# Patient Record
Sex: Male | Born: 1955 | Race: White | Hispanic: No | Marital: Married | Smoking: Never smoker
Health system: Southern US, Community
[De-identification: ages and names within clinical notes are randomized; demographics above are authoritative.]

## PROBLEM LIST (undated history)

## (undated) DIAGNOSIS — G5620 Lesion of ulnar nerve, unspecified upper limb: Secondary | ICD-10-CM

## (undated) DIAGNOSIS — R7989 Other specified abnormal findings of blood chemistry: Secondary | ICD-10-CM

## (undated) DIAGNOSIS — R112 Nausea with vomiting, unspecified: Secondary | ICD-10-CM

## (undated) DIAGNOSIS — I209 Angina pectoris, unspecified: Secondary | ICD-10-CM

## (undated) DIAGNOSIS — E119 Type 2 diabetes mellitus without complications: Secondary | ICD-10-CM

## (undated) DIAGNOSIS — T4145XA Adverse effect of unspecified anesthetic, initial encounter: Secondary | ICD-10-CM

## (undated) DIAGNOSIS — G4730 Sleep apnea, unspecified: Secondary | ICD-10-CM

## (undated) DIAGNOSIS — T8859XA Other complications of anesthesia, initial encounter: Secondary | ICD-10-CM

## (undated) DIAGNOSIS — K589 Irritable bowel syndrome without diarrhea: Secondary | ICD-10-CM

## (undated) DIAGNOSIS — K5792 Diverticulitis of intestine, part unspecified, without perforation or abscess without bleeding: Secondary | ICD-10-CM

## (undated) DIAGNOSIS — R9431 Abnormal electrocardiogram [ECG] [EKG]: Secondary | ICD-10-CM

## (undated) DIAGNOSIS — Z87442 Personal history of urinary calculi: Secondary | ICD-10-CM

## (undated) DIAGNOSIS — M47816 Spondylosis without myelopathy or radiculopathy, lumbar region: Secondary | ICD-10-CM

## (undated) DIAGNOSIS — R931 Abnormal findings on diagnostic imaging of heart and coronary circulation: Secondary | ICD-10-CM

## (undated) DIAGNOSIS — K5790 Diverticulosis of intestine, part unspecified, without perforation or abscess without bleeding: Secondary | ICD-10-CM

## (undated) DIAGNOSIS — K219 Gastro-esophageal reflux disease without esophagitis: Secondary | ICD-10-CM

## (undated) DIAGNOSIS — Z8673 Personal history of transient ischemic attack (TIA), and cerebral infarction without residual deficits: Secondary | ICD-10-CM

## (undated) DIAGNOSIS — H7012 Chronic mastoiditis, left ear: Secondary | ICD-10-CM

## (undated) DIAGNOSIS — Z9889 Other specified postprocedural states: Secondary | ICD-10-CM

## (undated) DIAGNOSIS — I10 Essential (primary) hypertension: Secondary | ICD-10-CM

## (undated) DIAGNOSIS — E669 Obesity, unspecified: Secondary | ICD-10-CM

## (undated) DIAGNOSIS — M1990 Unspecified osteoarthritis, unspecified site: Secondary | ICD-10-CM

## (undated) DIAGNOSIS — L719 Rosacea, unspecified: Secondary | ICD-10-CM

## (undated) DIAGNOSIS — E7800 Pure hypercholesterolemia, unspecified: Secondary | ICD-10-CM

## (undated) DIAGNOSIS — G2581 Restless legs syndrome: Secondary | ICD-10-CM

## (undated) DIAGNOSIS — R002 Palpitations: Secondary | ICD-10-CM

## (undated) DIAGNOSIS — R42 Dizziness and giddiness: Secondary | ICD-10-CM

## (undated) DIAGNOSIS — I639 Cerebral infarction, unspecified: Secondary | ICD-10-CM

## (undated) DIAGNOSIS — K760 Fatty (change of) liver, not elsewhere classified: Secondary | ICD-10-CM

## (undated) DIAGNOSIS — G459 Transient cerebral ischemic attack, unspecified: Secondary | ICD-10-CM

## (undated) DIAGNOSIS — Z6830 Body mass index (BMI) 30.0-30.9, adult: Secondary | ICD-10-CM

## (undated) DIAGNOSIS — R945 Abnormal results of liver function studies: Secondary | ICD-10-CM

## (undated) HISTORY — DX: Diverticulosis of intestine, part unspecified, without perforation or abscess without bleeding: K57.90

## (undated) HISTORY — DX: Pure hypercholesterolemia, unspecified: E78.00

## (undated) HISTORY — PX: COLONOSCOPY: SHX174

## (undated) HISTORY — DX: Palpitations: R00.2

## (undated) HISTORY — PX: ESOPHAGOGASTRODUODENOSCOPY: SHX1529

## (undated) HISTORY — DX: Body mass index (BMI) 30.0-30.9, adult: Z68.30

## (undated) HISTORY — DX: Type 2 diabetes mellitus without complications: E11.9

## (undated) HISTORY — DX: Personal history of transient ischemic attack (TIA), and cerebral infarction without residual deficits: Z86.73

## (undated) HISTORY — DX: Spondylosis without myelopathy or radiculopathy, lumbar region: M47.816

## (undated) HISTORY — DX: Irritable bowel syndrome, unspecified: K58.9

## (undated) HISTORY — PX: LITHOTRIPSY: SUR834

## (undated) HISTORY — PX: MULTIPLE TOOTH EXTRACTIONS: SHX2053

## (undated) HISTORY — DX: Abnormal electrocardiogram (ECG) (EKG): R94.31

## (undated) HISTORY — DX: Diverticulitis of intestine, part unspecified, without perforation or abscess without bleeding: K57.92

## (undated) HISTORY — PX: EYE SURGERY: SHX253

## (undated) HISTORY — DX: Lesion of ulnar nerve, unspecified upper limb: G56.20

## (undated) HISTORY — PX: OTHER SURGICAL HISTORY: SHX169

## (undated) HISTORY — DX: Obesity, unspecified: E66.9

## (undated) HISTORY — DX: Restless legs syndrome: G25.81

## (undated) HISTORY — DX: Rosacea, unspecified: L71.9

## (undated) HISTORY — DX: Abnormal findings on diagnostic imaging of heart and coronary circulation: R93.1

## (undated) HISTORY — DX: Unspecified osteoarthritis, unspecified site: M19.90

## (undated) HISTORY — DX: Dizziness and giddiness: R42

## (undated) HISTORY — DX: Chronic mastoiditis, left ear: H70.12

---

## 2000-07-28 ENCOUNTER — Encounter: Admission: RE | Admit: 2000-07-28 | Discharge: 2000-07-28 | Payer: Self-pay | Admitting: Family Medicine

## 2000-07-28 ENCOUNTER — Encounter: Payer: Self-pay | Admitting: Internal Medicine

## 2000-10-10 ENCOUNTER — Emergency Department (HOSPITAL_COMMUNITY): Admission: EM | Admit: 2000-10-10 | Discharge: 2000-10-10 | Payer: Self-pay | Admitting: Emergency Medicine

## 2003-04-07 ENCOUNTER — Encounter: Admission: RE | Admit: 2003-04-07 | Discharge: 2003-04-07 | Payer: Self-pay | Admitting: Internal Medicine

## 2003-04-07 ENCOUNTER — Encounter: Payer: Self-pay | Admitting: Internal Medicine

## 2004-09-28 ENCOUNTER — Emergency Department (HOSPITAL_COMMUNITY): Admission: EM | Admit: 2004-09-28 | Discharge: 2004-09-28 | Payer: Self-pay

## 2007-12-03 ENCOUNTER — Ambulatory Visit (HOSPITAL_COMMUNITY): Admission: RE | Admit: 2007-12-03 | Discharge: 2007-12-03 | Payer: Self-pay | Admitting: Internal Medicine

## 2007-12-29 ENCOUNTER — Emergency Department (HOSPITAL_COMMUNITY): Admission: EM | Admit: 2007-12-29 | Discharge: 2007-12-29 | Payer: Self-pay | Admitting: Emergency Medicine

## 2009-03-05 ENCOUNTER — Emergency Department (HOSPITAL_COMMUNITY): Admission: EM | Admit: 2009-03-05 | Discharge: 2009-03-05 | Payer: Self-pay | Admitting: Emergency Medicine

## 2009-07-29 ENCOUNTER — Ambulatory Visit (HOSPITAL_COMMUNITY): Admission: RE | Admit: 2009-07-29 | Discharge: 2009-07-29 | Payer: Self-pay | Admitting: Internal Medicine

## 2009-08-07 ENCOUNTER — Encounter: Admission: RE | Admit: 2009-08-07 | Discharge: 2009-08-07 | Payer: Self-pay | Admitting: Internal Medicine

## 2009-10-22 ENCOUNTER — Ambulatory Visit (HOSPITAL_COMMUNITY): Admission: RE | Admit: 2009-10-22 | Discharge: 2009-10-22 | Payer: Self-pay | Admitting: Gastroenterology

## 2009-11-05 ENCOUNTER — Encounter: Admission: RE | Admit: 2009-11-05 | Discharge: 2009-11-05 | Payer: Self-pay | Admitting: Gastroenterology

## 2010-04-08 ENCOUNTER — Emergency Department (HOSPITAL_COMMUNITY): Admission: EM | Admit: 2010-04-08 | Discharge: 2010-04-08 | Payer: Self-pay | Admitting: Emergency Medicine

## 2010-06-13 IMAGING — US US ABDOMEN COMPLETE
1 series · 14 of 25 positions shown · non-contrast
Comparison: None

CLINICAL DATA: Right upper quadrant pain and tenderness.  Elevated
liver function tests.

ABDOMINAL ULTRASOUND COMPLETE

[Series 1: us abdomen complete · 0.32mm/px · 14 of 85 slices shown]
[im 1/85]
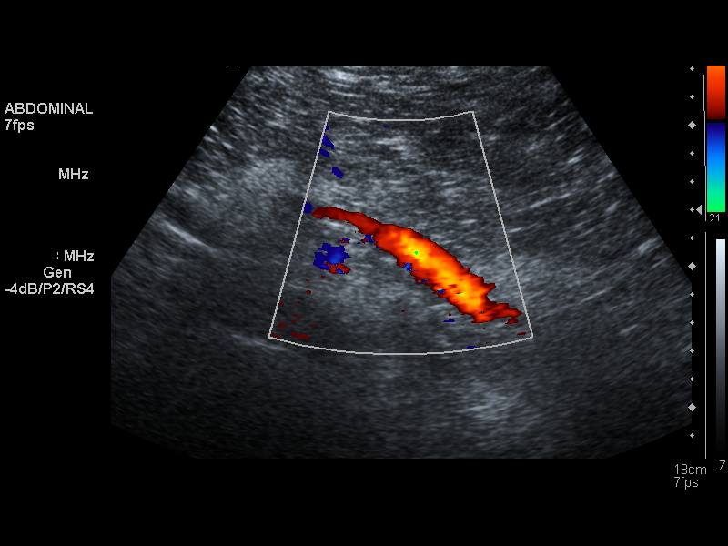
[im 8/85]
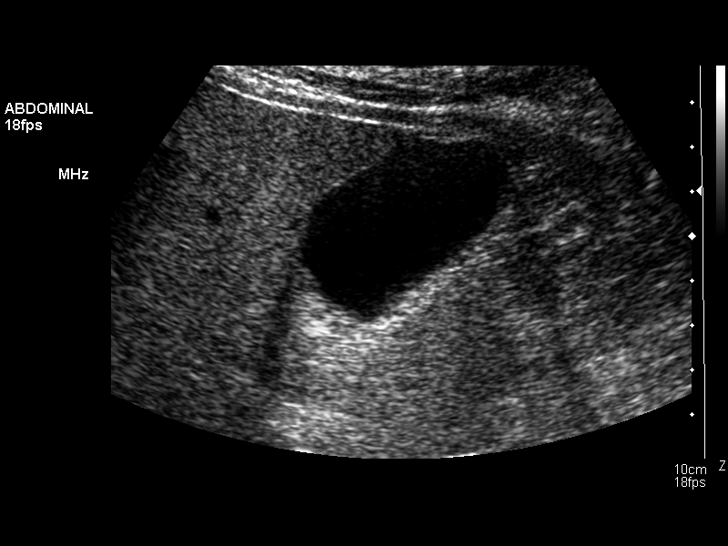
[im 15/85]
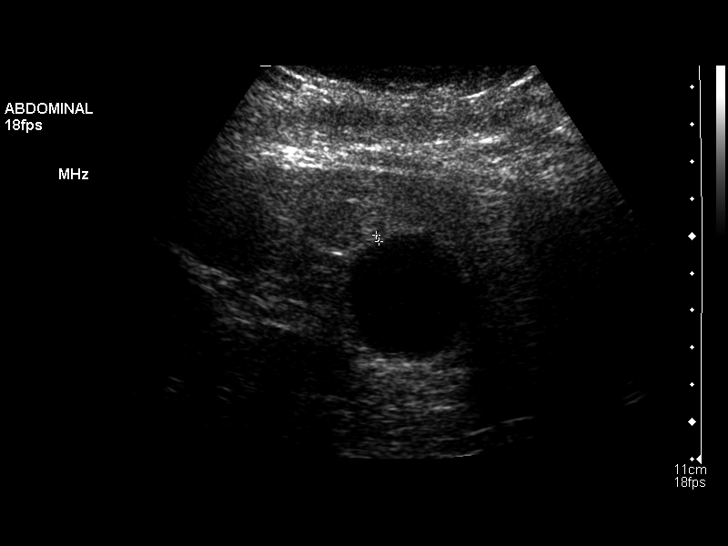
[im 22/85]
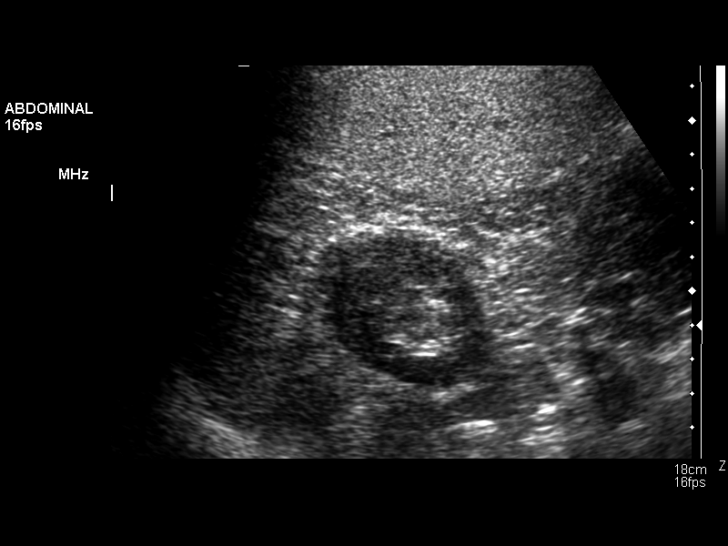
[im 29/85]
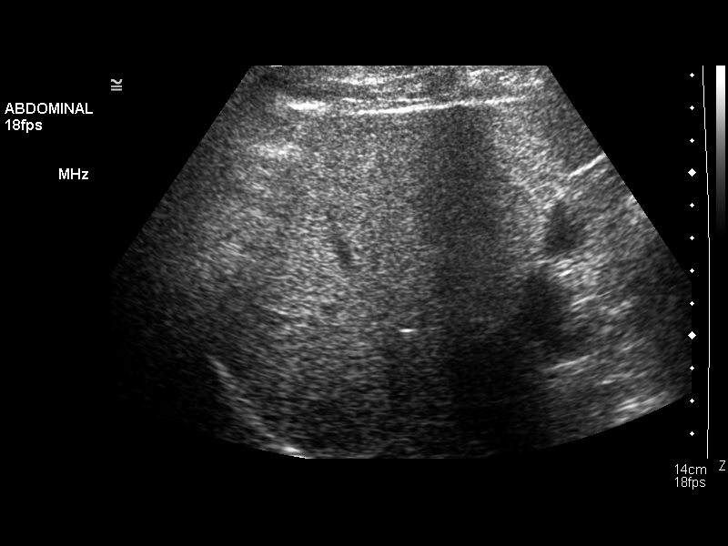
[im 32/85]
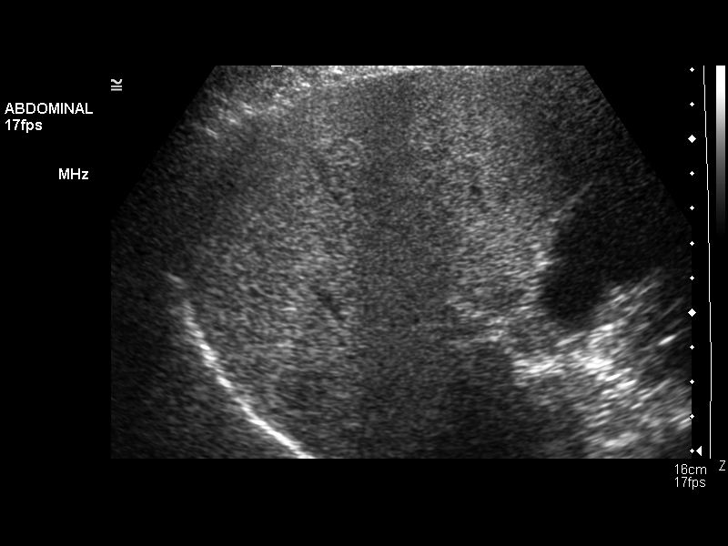
[im 39/85]
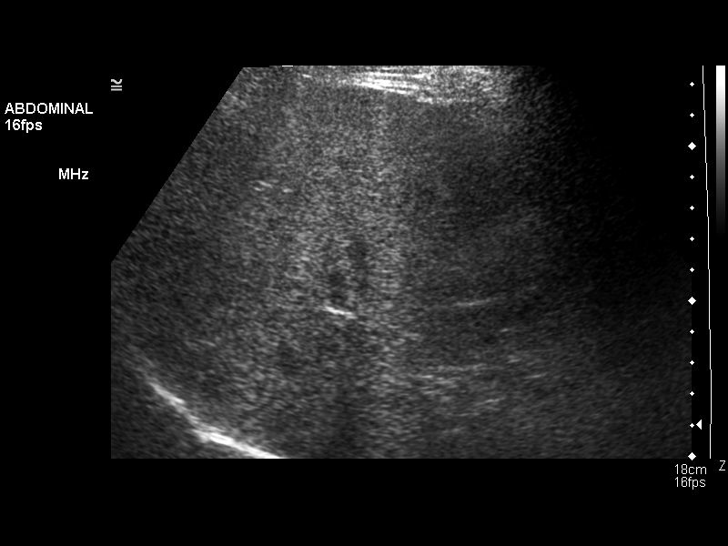
[im 46/85]
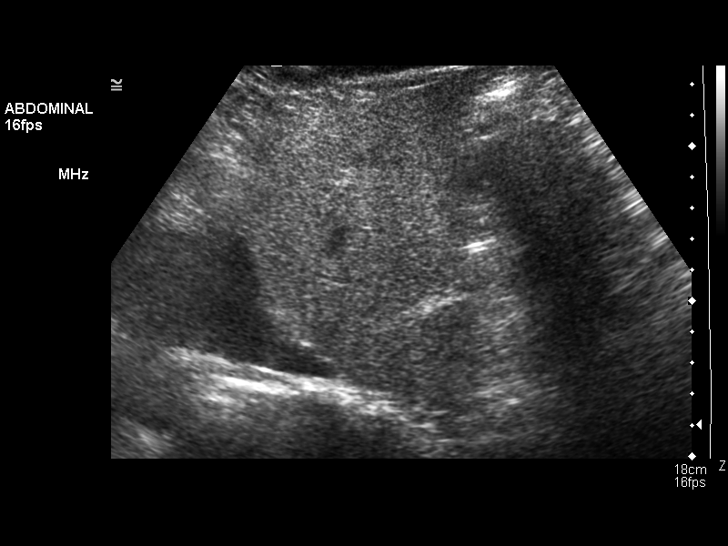
[im 53/85]
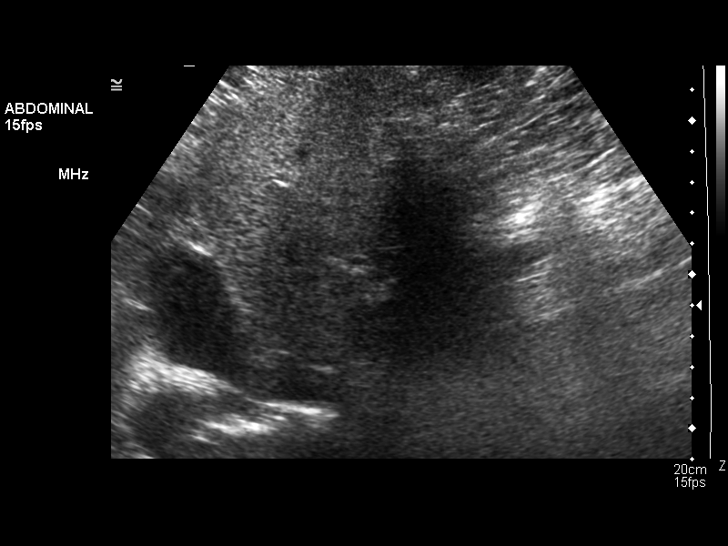
[im 57/85]
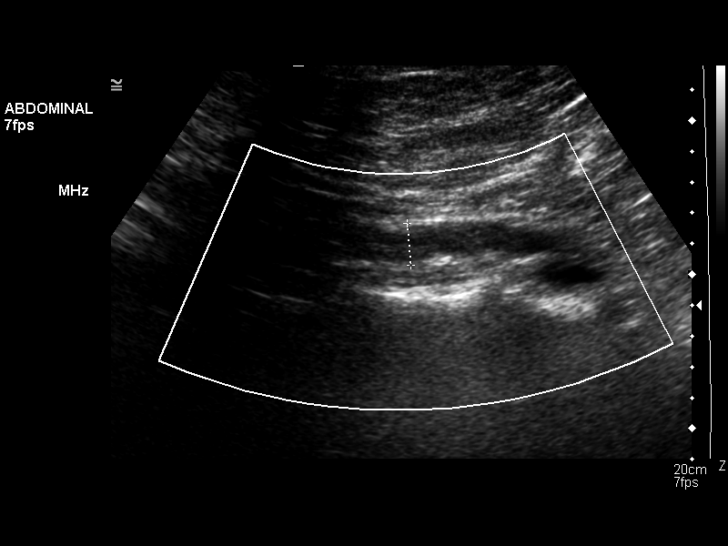
[im 64/85]
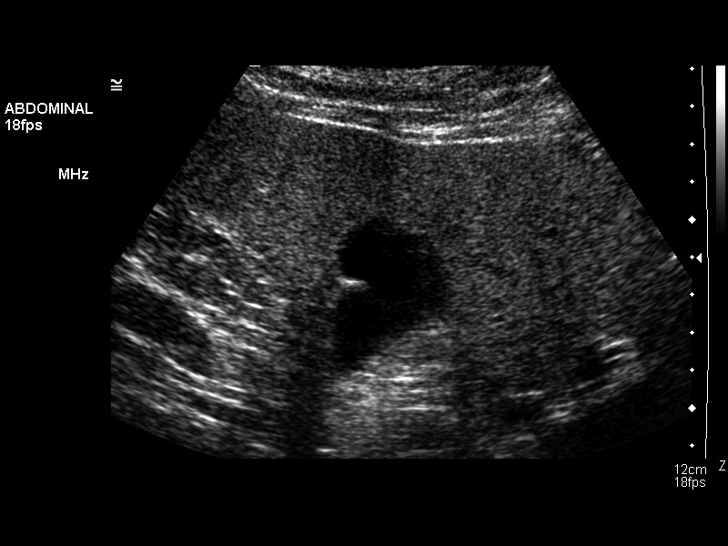
[im 71/85]
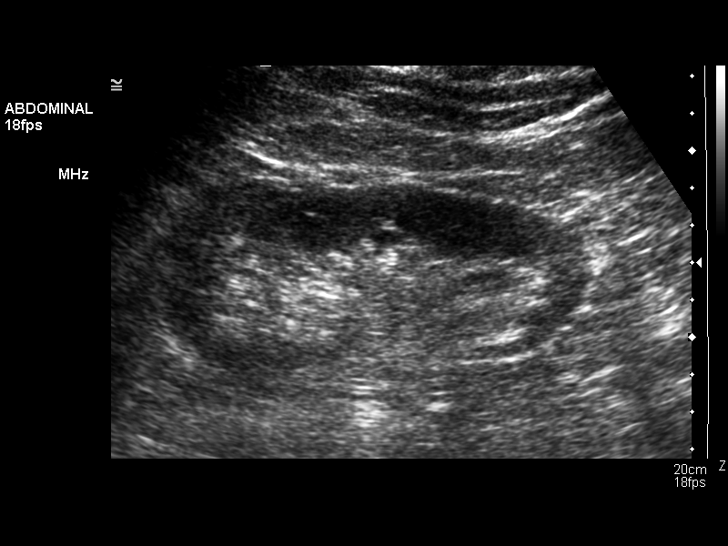
[im 78/85]
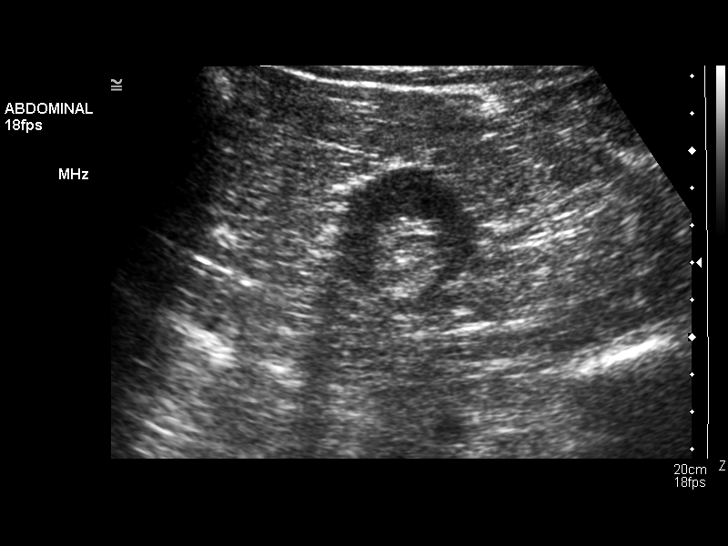
[im 85/85]
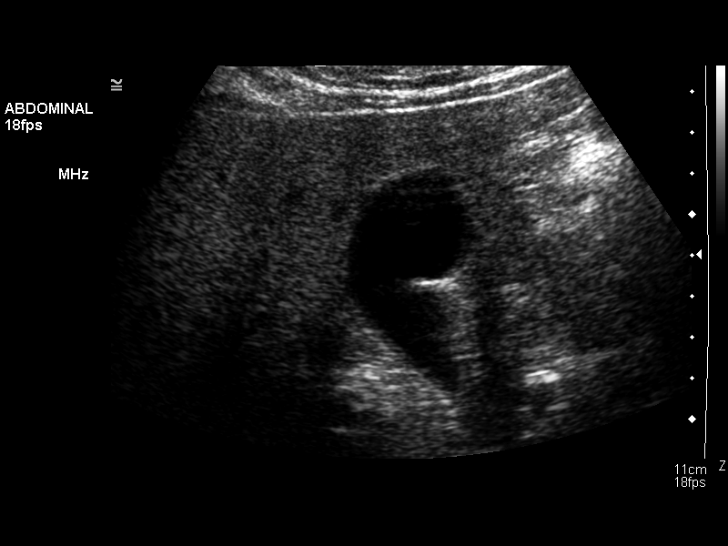

[14 of 25 positions shown; findings below may reference images not displayed]

FINDINGS: Gallbladder:  No gallstones, gallbladder wall thickening, or
pericholecystic fluid.

Common Bile Duct:  Within normal limits in caliber.  Measures 3 mm
in diameter.

Liver:  No focal lesion identified.  Mildly increased parenchymal
echogenicity, consistent with diffuse fatty infiltration of the
liver.

IVC:  Appears normal.

Pancreas:  Although the pancreas is difficult to visualize in its
entirety, no focal pancreatic abnormality is identified.

Spleen:  Within normal limits in size and echotexture.

Right kidney:  Normal in size and parenchymal echogenicity.  No
evidence of mass or hydronephrosis.

Left kidney:  Normal in size and parenchymal echogenicity.  No
evidence of mass or hydronephrosis.

Abdominal Aorta:  No aneurysm identified.
IMPRESSION: 1.  No evidence of gallstones, biliary dilatation, or other acute
findings.
2.  Mild diffuse fatty infiltration of the liver.

## 2010-12-26 ENCOUNTER — Encounter: Payer: Self-pay | Admitting: Urology

## 2011-02-22 LAB — URINALYSIS, ROUTINE W REFLEX MICROSCOPIC
Hgb urine dipstick: NEGATIVE
Nitrite: NEGATIVE
Protein, ur: NEGATIVE mg/dL
Specific Gravity, Urine: 1.021 (ref 1.005–1.030)
Urobilinogen, UA: 1 mg/dL (ref 0.0–1.0)

## 2011-02-22 LAB — URINE MICROSCOPIC-ADD ON

## 2011-05-31 ENCOUNTER — Other Ambulatory Visit: Payer: Self-pay | Admitting: Orthopaedic Surgery

## 2011-05-31 DIAGNOSIS — M25511 Pain in right shoulder: Secondary | ICD-10-CM

## 2011-06-01 ENCOUNTER — Ambulatory Visit
Admission: RE | Admit: 2011-06-01 | Discharge: 2011-06-01 | Disposition: A | Discharge status: Home / Self Care | Payer: BC Managed Care – PPO | Source: Ambulatory Visit | Attending: Orthopaedic Surgery | Admitting: Orthopaedic Surgery

## 2011-06-01 ENCOUNTER — Other Ambulatory Visit: Payer: Self-pay | Admitting: Orthopaedic Surgery

## 2011-06-01 DIAGNOSIS — M25511 Pain in right shoulder: Secondary | ICD-10-CM

## 2011-06-01 DIAGNOSIS — Z139 Encounter for screening, unspecified: Secondary | ICD-10-CM

## 2011-06-10 ENCOUNTER — Emergency Department (HOSPITAL_COMMUNITY): Payer: BC Managed Care – PPO

## 2011-06-10 ENCOUNTER — Emergency Department (HOSPITAL_COMMUNITY)
Admission: EM | Admit: 2011-06-10 | Discharge: 2011-06-10 | Disposition: A | Discharge status: Home / Self Care | Payer: BC Managed Care – PPO | Attending: Emergency Medicine | Admitting: Emergency Medicine

## 2011-06-10 ENCOUNTER — Ambulatory Visit
Admission: RE | Admit: 2011-06-10 | Discharge: 2011-06-10 | Disposition: A | Discharge status: Home / Self Care | Payer: BC Managed Care – PPO | Source: Ambulatory Visit | Attending: Internal Medicine | Admitting: Internal Medicine

## 2011-06-10 ENCOUNTER — Other Ambulatory Visit: Payer: Self-pay | Admitting: Internal Medicine

## 2011-06-10 DIAGNOSIS — R319 Hematuria, unspecified: Secondary | ICD-10-CM | POA: Insufficient documentation

## 2011-06-10 DIAGNOSIS — N390 Urinary tract infection, site not specified: Secondary | ICD-10-CM | POA: Insufficient documentation

## 2011-06-10 DIAGNOSIS — I10 Essential (primary) hypertension: Secondary | ICD-10-CM | POA: Insufficient documentation

## 2011-06-10 DIAGNOSIS — R1110 Vomiting, unspecified: Secondary | ICD-10-CM | POA: Insufficient documentation

## 2011-06-10 DIAGNOSIS — R109 Unspecified abdominal pain: Secondary | ICD-10-CM | POA: Insufficient documentation

## 2011-06-10 LAB — URINALYSIS, ROUTINE W REFLEX MICROSCOPIC
Bilirubin Urine: NEGATIVE
Glucose, UA: NEGATIVE mg/dL
Ketones, ur: NEGATIVE mg/dL
Protein, ur: 30 mg/dL — AB
pH: 5.5 (ref 5.0–8.0)

## 2011-06-10 LAB — DIFFERENTIAL
Basophils Absolute: 0 10*3/uL (ref 0.0–0.1)
Basophils Relative: 0 % (ref 0–1)
Lymphocytes Relative: 7 % — ABNORMAL LOW (ref 12–46)
Monocytes Absolute: 0.6 10*3/uL (ref 0.1–1.0)
Neutro Abs: 15.5 10*3/uL — ABNORMAL HIGH (ref 1.7–7.7)
Neutrophils Relative %: 89 % — ABNORMAL HIGH (ref 43–77)

## 2011-06-10 LAB — CBC
HCT: 46.9 % (ref 39.0–52.0)
Hemoglobin: 16.1 g/dL (ref 13.0–17.0)
MCHC: 34.3 g/dL (ref 30.0–36.0)
WBC: 17.4 10*3/uL — ABNORMAL HIGH (ref 4.0–10.5)

## 2011-06-10 LAB — URINE MICROSCOPIC-ADD ON

## 2011-06-10 LAB — BASIC METABOLIC PANEL
BUN: 19 mg/dL (ref 6–23)
Chloride: 102 mEq/L (ref 96–112)
GFR calc Af Amer: >60 mL/min (ref >60)
GFR calc non Af Amer: >60 mL/min (ref >60)
Glucose, Bld: 125 mg/dL — ABNORMAL HIGH (ref 70–99)
Potassium: 4.5 mEq/L (ref 3.5–5.1)
Sodium: 138 mEq/L (ref 135–145)

## 2011-06-10 MED ORDER — IOHEXOL 300 MG/ML  SOLN
100.0000 mL | Freq: Once | INTRAMUSCULAR | Status: AC | PRN
  Administered 2011-06-10: 100 mL via INTRAVENOUS

## 2011-06-12 LAB — URINE CULTURE: Culture  Setup Time: 201207070416

## 2011-06-20 ENCOUNTER — Ambulatory Visit (HOSPITAL_COMMUNITY)
Admission: RE | Admit: 2011-06-20 | Discharge: 2011-06-20 | Disposition: A | Discharge status: Home / Self Care | Payer: BC Managed Care – PPO | Source: Ambulatory Visit | Attending: Urology | Admitting: Urology

## 2011-06-20 DIAGNOSIS — I10 Essential (primary) hypertension: Secondary | ICD-10-CM | POA: Insufficient documentation

## 2011-06-20 DIAGNOSIS — Z538 Procedure and treatment not carried out for other reasons: Secondary | ICD-10-CM | POA: Insufficient documentation

## 2011-06-20 DIAGNOSIS — Z79899 Other long term (current) drug therapy: Secondary | ICD-10-CM | POA: Insufficient documentation

## 2011-06-20 DIAGNOSIS — M546 Pain in thoracic spine: Secondary | ICD-10-CM | POA: Insufficient documentation

## 2011-06-20 DIAGNOSIS — Z7982 Long term (current) use of aspirin: Secondary | ICD-10-CM | POA: Insufficient documentation

## 2011-06-20 DIAGNOSIS — E7800 Pure hypercholesterolemia, unspecified: Secondary | ICD-10-CM | POA: Insufficient documentation

## 2011-06-20 DIAGNOSIS — N200 Calculus of kidney: Secondary | ICD-10-CM | POA: Insufficient documentation

## 2011-06-23 ENCOUNTER — Ambulatory Visit (HOSPITAL_BASED_OUTPATIENT_CLINIC_OR_DEPARTMENT_OTHER)
Admission: RE | Admit: 2011-06-23 | Discharge: 2011-06-23 | Disposition: A | Discharge status: Home / Self Care | Payer: BC Managed Care – PPO | Source: Ambulatory Visit | Attending: Urology | Admitting: Urology

## 2011-06-23 DIAGNOSIS — N200 Calculus of kidney: Secondary | ICD-10-CM | POA: Insufficient documentation

## 2011-06-23 DIAGNOSIS — E669 Obesity, unspecified: Secondary | ICD-10-CM | POA: Insufficient documentation

## 2011-06-23 DIAGNOSIS — Z01810 Encounter for preprocedural cardiovascular examination: Secondary | ICD-10-CM | POA: Insufficient documentation

## 2011-06-23 DIAGNOSIS — I10 Essential (primary) hypertension: Secondary | ICD-10-CM | POA: Insufficient documentation

## 2011-06-23 DIAGNOSIS — Z01812 Encounter for preprocedural laboratory examination: Secondary | ICD-10-CM | POA: Insufficient documentation

## 2011-06-23 LAB — POCT I-STAT 4, (NA,K, GLUC, HGB,HCT)
Glucose, Bld: 117 mg/dL — ABNORMAL HIGH (ref 70–99)
HCT: 46 % (ref 39.0–52.0)
Hemoglobin: 15.6 g/dL (ref 13.0–17.0)
Potassium: 4.4 mEq/L (ref 3.5–5.1)
Sodium: 141 mEq/L (ref 135–145)

## 2011-06-23 NOTE — Op Note (Signed)
Jose [name], Jose [name]               ACCOUNT NO.:  [id number]  MEDICAL RECORD NO.:  [id number]  LOCATION:  XRAY                         FACILITY:  [facility]  PHYSICIAN:  Shep Porter C. [name], M.D.  DATE OF BIRTH:  [birth year]  DATE OF PROCEDURE:  06/23/2011 DATE OF DISCHARGE:  06/20/2011                              OPERATIVE REPORT   PREOPERATIVE DIAGNOSIS:  Left upper ureteral calculus.  POSTOPERATIVE DIAGNOSIS:  Left renal calculi.  PROCEDURE PERFORMED: 1. Cystoscopy with left retrograde pyelogram including interpretation. 2. Left ureteroscopy. 3. In situ laser lithotripsy. 4. Ureteroscopic stone extraction. 5. Left ureteral stent (6-French, 24 cm Polaris with string attached).  SURGEON:  Zakariye Nee C. [name], M.D.  ANESTHESIA:  General.  SPECIMEN:  Stone, given to the patient.  DRAINS:  As above.  ESTIMATED BLOOD LOSS:  Minimal.  COMPLICATIONS:  None.  INDICATIONS FOR PROCEDURE:  The patient is a 55 year old male who developed acute onset of left flank pain and was seen in my office with KUB but no stone could be definitely visualized.  He did have microscopic hematuria and eventually had to go to the emergency room because of persistent pain.  There, CT scan confirmed stone in the upper left ureteral region near the UPJ.  We therefore elected to proceed with lithotripsy but the stone could not be visualized on the lithotripter, so he is brought to the operating room for ureteroscopic treatment of the stone.  The risks, complications, alternatives, and limitations have been discussed and he understands and has elected to proceed.  DESCRIPTION OF OPERATION:  After informed consent, the patient was brought to the major OR, placed on the table and administered general anesthesia.  He was then moved to the dorsal lithotomy position and his genitalia was sterilely prepped and draped.  An official time-out was then performed.  Initially, the 22-French cystoscope with 12-degree was  advanced under direct vision down to the urethra which was noted to be entirely normal. The prostatic urethra was noted to be free of lesions and nonobstructing.  Upon entering the bladder, it was fully and systemically inspected and noted to be free of any tumor, stones, or inflammatory lesions.  Ureteral orifices were noted to be of normal configuration and position.  A 6-French open-ended ureteral catheter was then passed through the cystoscope, entered the left ureteral orifice and a left retrograde pyelogram was performed in standard fashion by injecting full strength contrast up the left ureter under fluoroscopic control.  This revealed a filling defect in the area of the renal pelvis near the UPJ consistent with his stone.  No other abnormality of the collecting system or ureter was noted however.  A 0.038-inch floppy-tip guidewire was then passed through the open end of catheter into the area of the renal pelvis.  The open area catheter was removed.  This was replaced with a ureteral access sheath.  This was passed over the guidewire and the inner portion or the access sheath and guidewire were removed leaving the access sheath in place after which flexible, digital ureteroscopy was performed using 6-French scope.  This revealed the stone in the renal pelvis which had first began fragmenting with the holmium laser fiber using  at 200 microns fiber; and after it had fragmented partially, I then used the 9-0 basket to grasp the stone and extract it without difficulty.  I also inspected each of the calyces and found a single sub millimeter calcification adherent to the renal papilla.  This was dislodged and extracted with the 9-0 basket as well. A third stone was identified in the calyx and extracted as well.  No further calyceal stones were identified, not were there any stone fragments remaining.  I therefore replaced the guidewire and removed the access sheath.  The cystoscope  was back loaded over the guidewire and the stent was passed over the guidewire at the area of the renal pelvis.  The bladder was then drained and the string left fixed to the distal aspect of the stent was secured to the dorsum of the penis.  I then injected 2% lidocaine jelly in the urethra and placed a penile clamp.  The patient was awakened and taken to recovery room in stable and satisfactory condition.  He tolerated the procedure well.  No intraoperative complications.  He will be given a prescription of 200 mg Pyridium #36 and Tylox #30 with followup in my office in 1 week for stent removal. Written instructions were given upon discharge.     Jose [name], M.D.     MCO/MEDQ  D:  06/23/2011  T:  06/23/2011  Job:  [id number]  Electronically Signed by Ihor Gully M.D. on 06/23/2011 10:16:22 AM

## 2011-08-25 LAB — URINALYSIS, ROUTINE W REFLEX MICROSCOPIC
Leukocytes, UA: NEGATIVE
Nitrite: NEGATIVE
Protein, ur: NEGATIVE
Specific Gravity, Urine: 1.023
Urobilinogen, UA: 0.2

## 2011-08-25 LAB — URINE MICROSCOPIC-ADD ON

## 2011-10-13 ENCOUNTER — Other Ambulatory Visit: Payer: Self-pay | Admitting: Orthopaedic Surgery

## 2011-10-14 ENCOUNTER — Encounter (HOSPITAL_BASED_OUTPATIENT_CLINIC_OR_DEPARTMENT_OTHER): Payer: Self-pay | Admitting: *Deleted

## 2011-10-14 NOTE — H&P (Signed)
    Patient Name: Jose [name] DOB: 09-20-1956 MRN: [id number]    SUBJECTIVE:  Jose [name]  said the injection really  did  nothing for him except elevate his blood sugar.   He called in and we set him up with an MRI scan.  He has terrible pain and says he actually wakes in tears due to the discomfort.  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  Again, this has been going on for more than 5 years.  PAST   MEDICAL   HISTORY:   His   past   medical,   family,   and   social   histories   are   reviewed   and unchanged  based  on  his  03/07/11  intake  sheet.   His  medical  coverage  is  Dr. Kirby Funk.   He  is  on Benicar for hypertension.  He has no drug allergies.  Review of systems reviewed thoroughly and all other systems are negative as related to the chief complaint.  PHYSICAL  EXAM:  Jose [name] remains a well-developed, well-nourished man in no apparent distress, and alert and oriented x3.  He has full cervical motion and no palpable lymphadenopathy.  His right shoulder motion  is  full  at  150,  60,  and  L1  but  he  persists  with  impressive  primary  and  secondary  impingement pain.   He has good cuff strength though he has a very painful arc.  There is no AC pain.  Sensation and motor  function  are  intact  in  his  hands.   Palpable  pulses  on  both  sides.   The  opposite  shoulder  exam  is benign.  He has normal extraocular motion.  Equal and reactive pupils.  RADIOGRAPHS:  Other reviewed images:  I reviewed an MRI scan, films and report, of a study done at [facility]   on   06/02/11.    This   shows   a   full   thickness,   moderately   retracted   tear   of   the supraspinatus.  ASSESSMENT:  Right shoulder rotator cuff tear.  PLAN:   Jose [name]  has an impressive rotator cuff tear by  MRI.   He got no relief with the injection.  He has pain which wakes him from sleep and limits the use of his arm.  He  needs to consider an arthroscopy with repair.  I discussed risks of anesthesia and infection related to this intervention and the fact that he would be  in  a  sling  for  a  month  and  unable  to  do  his  regular  job  for  3  to  4  months.   He  could  certainly  do something light duty much earlier.  He is going to get with his wife and also his employer to figure out timing.   Our plan would  be to  perform an acromioplasty  in  the co-plane of the [facility] joint along  with  the

## 2011-10-17 ENCOUNTER — Encounter (HOSPITAL_BASED_OUTPATIENT_CLINIC_OR_DEPARTMENT_OTHER)
Admission: RE | Admit: 2011-10-17 | Discharge: 2011-10-17 | Disposition: A | Discharge status: Home / Self Care | Payer: BC Managed Care – PPO | Source: Ambulatory Visit | Attending: Orthopaedic Surgery | Admitting: Orthopaedic Surgery

## 2011-10-17 LAB — BASIC METABOLIC PANEL
CO2: 26 mEq/L (ref 19–32)
Calcium: 9.8 mg/dL (ref 8.4–10.5)
Creatinine, Ser: 0.92 mg/dL (ref 0.50–1.35)
GFR calc non Af Amer: >90 mL/min (ref >90)
Glucose, Bld: 170 mg/dL — ABNORMAL HIGH (ref 70–99)
Sodium: 140 mEq/L (ref 135–145)

## 2011-10-18 ENCOUNTER — Encounter (HOSPITAL_BASED_OUTPATIENT_CLINIC_OR_DEPARTMENT_OTHER): Payer: Self-pay | Admitting: Anesthesiology

## 2011-10-18 ENCOUNTER — Encounter (HOSPITAL_BASED_OUTPATIENT_CLINIC_OR_DEPARTMENT_OTHER)
Admission: RE | Disposition: A | Discharge status: Home / Self Care | Payer: Self-pay | Source: Ambulatory Visit | Attending: Orthopaedic Surgery

## 2011-10-18 ENCOUNTER — Ambulatory Visit (HOSPITAL_BASED_OUTPATIENT_CLINIC_OR_DEPARTMENT_OTHER)
Admission: RE | Admit: 2011-10-18 | Discharge: 2011-10-18 | Disposition: A | Discharge status: Home / Self Care | Payer: BC Managed Care – PPO | Source: Ambulatory Visit | Attending: Orthopaedic Surgery | Admitting: Orthopaedic Surgery

## 2011-10-18 ENCOUNTER — Ambulatory Visit (HOSPITAL_BASED_OUTPATIENT_CLINIC_OR_DEPARTMENT_OTHER): Payer: BC Managed Care – PPO | Admitting: Anesthesiology

## 2011-10-18 ENCOUNTER — Encounter (HOSPITAL_BASED_OUTPATIENT_CLINIC_OR_DEPARTMENT_OTHER): Payer: Self-pay | Admitting: *Deleted

## 2011-10-18 DIAGNOSIS — Z01812 Encounter for preprocedural laboratory examination: Secondary | ICD-10-CM | POA: Insufficient documentation

## 2011-10-18 DIAGNOSIS — M719 Bursopathy, unspecified: Secondary | ICD-10-CM | POA: Insufficient documentation

## 2011-10-18 DIAGNOSIS — M67919 Unspecified disorder of synovium and tendon, unspecified shoulder: Secondary | ICD-10-CM | POA: Insufficient documentation

## 2011-10-18 DIAGNOSIS — E669 Obesity, unspecified: Secondary | ICD-10-CM | POA: Insufficient documentation

## 2011-10-18 DIAGNOSIS — I10 Essential (primary) hypertension: Secondary | ICD-10-CM | POA: Insufficient documentation

## 2011-10-18 DIAGNOSIS — M25819 Other specified joint disorders, unspecified shoulder: Secondary | ICD-10-CM | POA: Insufficient documentation

## 2011-10-18 HISTORY — PX: SHOULDER ARTHROSCOPY: SHX128

## 2011-10-18 HISTORY — DX: Essential (primary) hypertension: I10

## 2011-10-18 SURGERY — ARTHROSCOPY, SHOULDER
Anesthesia: General | Site: Shoulder | Laterality: Right | Wound class: Clean

## 2011-10-18 MED ORDER — KETOROLAC TROMETHAMINE 30 MG/ML IJ SOLN
15.0000 mg | Freq: Once | INTRAMUSCULAR | Status: AC | PRN
  Administered 2011-10-18: 30 mg via INTRAVENOUS

## 2011-10-18 MED ORDER — EPHEDRINE SULFATE 50 MG/ML IJ SOLN
INTRAMUSCULAR | Status: DC | PRN
  Administered 2011-10-18: 10 mg via INTRAVENOUS

## 2011-10-18 MED ORDER — ONDANSETRON HCL 4 MG/2ML IJ SOLN
INTRAMUSCULAR | Status: DC | PRN
  Administered 2011-10-18: 4 mg via INTRAVENOUS

## 2011-10-18 MED ORDER — DEXAMETHASONE SODIUM PHOSPHATE 10 MG/ML IJ SOLN
INTRAMUSCULAR | Status: DC | PRN
  Administered 2011-10-18: 10 mg via INTRAVENOUS

## 2011-10-18 MED ORDER — HYDROCODONE-ACETAMINOPHEN 5-325 MG PO TABS: 2.0000 | ORAL_TABLET | Freq: Four times a day (QID) | ORAL | Status: DC | PRN

## 2011-10-18 MED ORDER — BUPIVACAINE-EPINEPHRINE PF 0.5-1:200000 % IJ SOLN
INTRAMUSCULAR | Status: DC | PRN
  Administered 2011-10-18: 30 mL

## 2011-10-18 MED ORDER — MIDAZOLAM HCL 2 MG/2ML IJ SOLN
2.0000 mg | Freq: Once | INTRAMUSCULAR | Status: AC
  Administered 2011-10-18: 2 mg via INTRAVENOUS

## 2011-10-18 MED ORDER — PROMETHAZINE HCL 25 MG/ML IJ SOLN: 6.2500 mg | INTRAMUSCULAR | Status: DC | PRN

## 2011-10-18 MED ORDER — PROPOFOL 10 MG/ML IV EMUL
INTRAVENOUS | Status: DC | PRN
  Administered 2011-10-18: 200 mg via INTRAVENOUS

## 2011-10-18 MED ORDER — SUCCINYLCHOLINE CHLORIDE 20 MG/ML IJ SOLN
INTRAMUSCULAR | Status: DC | PRN
  Administered 2011-10-18: 100 mg via INTRAVENOUS

## 2011-10-18 MED ORDER — CEFAZOLIN SODIUM 1-5 GM-% IV SOLN
INTRAVENOUS | Status: DC | PRN
  Administered 2011-10-18: 2 g via INTRAVENOUS

## 2011-10-18 MED ORDER — LACTATED RINGERS IV SOLN
INTRAVENOUS | Status: DC
  Administered 2011-10-18 (×2): via INTRAVENOUS

## 2011-10-18 MED ORDER — HYDROMORPHONE HCL PF 1 MG/ML IJ SOLN
0.2500 mg | INTRAMUSCULAR | Status: DC | PRN
  Administered 2011-10-18 (×2): 0.5 mg via INTRAVENOUS

## 2011-10-18 MED ORDER — SODIUM CHLORIDE 0.9 % IR SOLN
Status: DC | PRN
  Administered 2011-10-18 (×2): 3000 mL
  Administered 2011-10-18: 6000 mL

## 2011-10-18 MED ORDER — FENTANYL CITRATE 0.05 MG/ML IJ SOLN
INTRAMUSCULAR | Status: DC | PRN
  Administered 2011-10-18: 50 ug via INTRAVENOUS

## 2011-10-18 MED ORDER — LIDOCAINE HCL 1.5 % IJ SOLN
INTRAMUSCULAR | Status: DC | PRN
  Administered 2011-10-18: 60 mL via INTRADERMAL

## 2011-10-18 MED ORDER — MEPERIDINE HCL 25 MG/ML IJ SOLN: 6.2500 mg | INTRAMUSCULAR | Status: DC | PRN

## 2011-10-18 MED ORDER — FENTANYL CITRATE 0.05 MG/ML IJ SOLN
100.0000 ug | Freq: Once | INTRAMUSCULAR | Status: AC
  Administered 2011-10-18: 100 ug via INTRAVENOUS

## 2011-10-18 SURGICAL SUPPLY — 72 items
ADH SKN CLS APL DERMABOND .7 (GAUZE/BANDAGES/DRESSINGS)
APL SKNCLS STERI-STRIP NONHPOA (GAUZE/BANDAGES/DRESSINGS)
BENZOIN TINCTURE PRP APPL 2/3 (GAUZE/BANDAGES/DRESSINGS) IMPLANT
BLADE CUDA 5.5 (BLADE) IMPLANT
BLADE GREAT WHITE 4.2 (BLADE) ×2 IMPLANT
BLADE SURG 15 STRL LF DISP TIS (BLADE) IMPLANT
BLADE SURG 15 STRL SS (BLADE)
BUR VERTEX HOODED 4.5 (BURR) ×1 IMPLANT
CANISTER OMNI JUG 16 LITER (MISCELLANEOUS) ×2 IMPLANT
CANISTER SUCTION 2500CC (MISCELLANEOUS) IMPLANT
CANNULA SHOULDER 7CM (CANNULA) ×2 IMPLANT
CANNULA TWIST IN 8.25X7CM (CANNULA) IMPLANT
DECANTER SPIKE VIAL GLASS SM (MISCELLANEOUS) IMPLANT
DERMABOND ADVANCED (GAUZE/BANDAGES/DRESSINGS)
DERMABOND ADVANCED .7 DNX12 (GAUZE/BANDAGES/DRESSINGS) IMPLANT
DRAPE STERI 35X30 U-POUCH (DRAPES) ×2 IMPLANT
DRAPE U-SHAPE 47X51 STRL (DRAPES) ×2 IMPLANT
DRAPE U-SHAPE 76X120 STRL (DRAPES) ×4 IMPLANT
DRSG EMULSION OIL 3X3 NADH (GAUZE/BANDAGES/DRESSINGS) ×2 IMPLANT
DRSG PAD ABDOMINAL 8X10 ST (GAUZE/BANDAGES/DRESSINGS) ×3 IMPLANT
DURAPREP 26ML APPLICATOR (WOUND CARE) ×2 IMPLANT
ELECT MENISCUS 165MM 90D (ELECTRODE) IMPLANT
ELECT REM PT RETURN 9FT ADLT (ELECTROSURGICAL) ×2
ELECTRODE REM PT RTRN 9FT ADLT (ELECTROSURGICAL) ×1 IMPLANT
GLOVE BIO SURGEON STRL SZ7 (GLOVE) ×1 IMPLANT
GLOVE BIO SURGEON STRL SZ8.5 (GLOVE) ×2 IMPLANT
GLOVE BIOGEL PI IND STRL 8 (GLOVE) ×1 IMPLANT
GLOVE BIOGEL PI IND STRL 8.5 (GLOVE) ×1 IMPLANT
GLOVE BIOGEL PI INDICATOR 8 (GLOVE) ×1
GLOVE BIOGEL PI INDICATOR 8.5 (GLOVE) ×1
GLOVE SS BIOGEL STRL SZ 8 (GLOVE) ×1 IMPLANT
GLOVE SUPERSENSE BIOGEL SZ 8 (GLOVE) ×2
GOWN PREVENTION PLUS XLARGE (GOWN DISPOSABLE) ×2 IMPLANT
GOWN PREVENTION PLUS XXLARGE (GOWN DISPOSABLE) ×3 IMPLANT
NDL SCORPION MULTI FIRE (NEEDLE) IMPLANT
NDL SUT 6 .5 CRC .975X.05 MAYO (NEEDLE) IMPLANT
NEEDLE MAYO TAPER (NEEDLE)
NEEDLE SCORPION MULTI FIRE (NEEDLE) ×2 IMPLANT
PACK ARTHROSCOPY DSU (CUSTOM PROCEDURE TRAY) ×2 IMPLANT
PACK BASIN DAY SURGERY FS (CUSTOM PROCEDURE TRAY) ×2 IMPLANT
PASSER SUT SWANSON 36MM LOOP (INSTRUMENTS) IMPLANT
PENCIL BUTTON HOLSTER BLD 10FT (ELECTRODE) IMPLANT
Pushlock 4.5 ×2 IMPLANT
SET ARTHROSCOPY TUBING (MISCELLANEOUS) ×2
SET ARTHROSCOPY TUBING LN (MISCELLANEOUS) ×1 IMPLANT
SLEEVE SCD COMPRESS KNEE MED (MISCELLANEOUS) ×1 IMPLANT
SLING ARM FOAM STRAP LRG (SOFTGOODS) IMPLANT
SLING ARM FOAM STRAP MED (SOFTGOODS) IMPLANT
SLING ARM FOAM STRAP SML (SOFTGOODS) IMPLANT
SLING ARM FOAM STRAP XLG (SOFTGOODS) ×1 IMPLANT
SPONGE GAUZE 4X4 12PLY (GAUZE/BANDAGES/DRESSINGS) ×3 IMPLANT
SPONGE GAUZE 4X4 FOR O.R. (GAUZE/BANDAGES/DRESSINGS) ×1 IMPLANT
SPONGE LAP 4X18 X RAY DECT (DISPOSABLE) IMPLANT
STRIP CLOSURE SKIN 1/2X4 (GAUZE/BANDAGES/DRESSINGS) IMPLANT
SUCTION FRAZIER TIP 10 FR DISP (SUCTIONS) IMPLANT
SUT ETHIBOND 2 OS 4 DA (SUTURE) IMPLANT
SUT ETHILON 3 0 PS 1 (SUTURE) IMPLANT
SUT ETHILON 4 0 PS 2 18 (SUTURE) ×2 IMPLANT
SUT FIBERWIRE #2 38 T-5 BLUE (SUTURE) ×4
SUT PDS AB 2-0 CT2 27 (SUTURE) IMPLANT
SUT VIC AB 0 SH 27 (SUTURE) IMPLANT
SUT VIC AB 2-0 SH 27 (SUTURE)
SUT VIC AB 2-0 SH 27XBRD (SUTURE) IMPLANT
SUT VICRYL 4-0 PS2 18IN ABS (SUTURE) IMPLANT
SUTURE FIBERWR #2 38 T-5 BLUE (SUTURE) IMPLANT
SYR BULB 3OZ (MISCELLANEOUS) IMPLANT
TAPE CLOTH SURG 4X10 WHT LF (GAUZE/BANDAGES/DRESSINGS) ×1 IMPLANT
TOWEL OR 17X24 6PK STRL BLUE (TOWEL DISPOSABLE) ×2 IMPLANT
TOWEL OR NON WOVEN STRL DISP B (DISPOSABLE) ×2 IMPLANT
WAND STAR VAC 90 (SURGICAL WAND) ×2 IMPLANT
WATER STERILE IRR 1000ML POUR (IV SOLUTION) ×2 IMPLANT
YANKAUER SUCT BULB TIP NO VENT (SUCTIONS) IMPLANT

## 2011-10-18 NOTE — Op Note (Signed)
Jose [name], Jose [name] NO.:  [id number]  MEDICAL RECORD NO.:  [id number]  LOCATION:                               FACILITY:  [facility]  PHYSICIAN:  Lubertha Basque. Jacquita Mulhearn, M.D.DATE OF BIRTH:  1956-08-06  DATE OF PROCEDURE:  10/18/2011 DATE OF DISCHARGE:                              OPERATIVE REPORT   PREOPERATIVE DIAGNOSES: 1. Right shoulder impingement. 2. Right shoulder acromioclavicular degeneration. 3. Right shoulder rotator cuff tear.  POSTOPERATIVE DIAGNOSES: 1. Right shoulder impingement. 2. Right shoulder acromioclavicular degeneration. 3. Right shoulder rotator cuff tear.  PROCEDURES: 1. Right shoulder arthroscopic acromioplasty. 2. Right shoulder arthroscopic partial claviculectomy. 3. Right shoulder arthroscopic rotator cuff repair.  ANESTHESIA:  General and block.  ATTENDING SURGEON:  Lubertha Basque. Jerl Santos, M.D.  ASSISTANT:  Lindwood Qua, P.A.  INDICATION FOR PROCEDURE:  The patient is a 55 year old male with many year history of right shoulder difficulty.  This has persisted despite oral antiinflammatories and injection which did not afford even transient relief.  He has been through therapy programs as well.  By MRI scan, he has rotator cuff tear and things consistent with impingement. He has pain which limits his ability to rest and use his arm.  He is offered an arthroscopy.  Informed operative consent was obtained after discussion of possible complications, including reaction to anesthesia and infection.  SUMMARY OF FINDINGS AND PROCEDURE:  Under general anesthesia and a block, a right shoulder arthroscopy was performed.  The glenohumeral joint showed no degenerative changes and the biceps tendon looked normal.  The rotator cuff had a tear seen from below.  In the subacromial space, this was confirmed with about a 1.5 cm cuff tear in the supraspinatus portion of the cuff which was minimally retracted and of good tissue.  I performed an  acromioplasty and a partial claviculectomy, decompressing the rotator cuff and then repaired the cuff tear with two reverse mattress sutures of FiberWire, secured with PushLock anchors over the bleeding bed of bone.  Lindwood Qua assisted throughout and was enviable to the completion of the case, and that he passed instruments and made this possible in an arthroscopic fashion.  DESCRIPTION OF PROCEDURE:  The patient was taken to the operating suite where general anesthetic applied without difficulty.  He was also given a block in the preanesthesia area.  He was positioned in the beachchair position and prepped and draped in normal sterile fashion.  After administration of IV Kefzol, an arthroscopy of the right shoulder was performed through total of 3 portals.  Findings were as noted above and procedure consisted of the acromioplasty done with a bur in the lateral position followed by transfer of the bur to the posterior position.  I performed a partial undersurface claviculectomy in a similar fashion. We then freshened the rotator cuff stable tissues of the torn area and created a bleeding bed of bone.  I used a scorpion device to pass two reverse mattress sutures with #2 FiberWire and then secured these over the bleeding bed of bone, repairing the rotator cuff nicely. Arthroscopic equipment was removed after the shoulder was irrigated. Simple sutures of nylon were used to loosely reapproximate the portals, followed by Adaptic,  dry gauze, and tape.  Estimated blood loss and intraoperative fluids obtained from anesthesia records.  DISPOSITION:  The patient was extubated in the operating room and taken to recovery room in stable addition.  Plans were for him to go home the same day, and followup in the office closely.  I will contact him by phone tonight.     Lubertha Basque Jerl Santos, M.D.     PGD/MEDQ  D:  10/18/2011  T:  10/18/2011  Job:  [id number]

## 2011-10-18 NOTE — Anesthesia Preprocedure Evaluation (Addendum)
Anesthesia Evaluation  Patient identified by MRN, date of birth, ID band Patient awake    Reviewed: Allergy & Precautions, H&P , NPO status , Patient's Chart, lab work & pertinent test results  History of Anesthesia Complications Negative for: history of anesthetic complications  Airway Mallampati: II  Neck ROM: Full    Dental  (+) Teeth Intact and Dental Advisory Given,    Pulmonary neg pulmonary ROS,  clear to auscultation        Cardiovascular hypertension, Regular Normal    Neuro/Psych  Neuromuscular disease    GI/Hepatic Neg liver ROS, hiatal hernia,   Endo/Other  Negative Endocrine ROS  Renal/GU negative Renal ROS     Musculoskeletal   Abdominal (+) obese,   Peds  Hematology   Anesthesia Other Findings   Reproductive/Obstetrics                          Anesthesia Physical Anesthesia Plan  ASA: II  Anesthesia Plan: General   Post-op Pain Management:    Induction: Intravenous  Airway Management Planned: Oral ETT  Additional Equipment:   Intra-op Plan:   Post-operative Plan: Extubation in OR  Informed Consent: I have reviewed the patients History and Physical, chart, labs and discussed the procedure including the risks, benefits and alternatives for the proposed anesthesia with the patient or authorized representative who has indicated his/her understanding and acceptance.   Dental advisory given  Plan Discussed with: CRNA and Surgeon  Anesthesia Plan Comments:         Anesthesia Quick Evaluation

## 2011-10-18 NOTE — Transfer of Care (Signed)
Immediate Anesthesia Transfer of Care Note  Patient: Jose [name]  Procedure(s) Performed:  ARTHROSCOPY SHOULDER - right shoulder arthroscopy , acromioplasty, partial DCR and rotator cuff repair  Patient Location: PACU  Anesthesia Type: General  Level of Consciousness: awake  Airway & Oxygen Therapy: Patient Spontanous Breathing and Patient connected to face mask oxygen  Post-op Assessment: Report given to PACU RN and Post -op Vital signs reviewed and stable  Post vital signs: Reviewed and stable  Complications: No apparent anesthesia complications

## 2011-10-18 NOTE — Addendum Note (Signed)
Addendum  created 10/18/11 1418 by Josepha Pigg, MD   Modules edited:Notes Section, Orders

## 2011-10-18 NOTE — H&P (Signed)
H&P reviewed and patient examined.  No significant changes.  Acceptable for surgery.  Jose [name] G 

## 2011-10-18 NOTE — Interval H&P Note (Signed)
History and Physical Interval Note:   10/18/2011   7:51 AM   Jose [name]  has presented today for surgery, with the diagnosis of impingement and rotator cuff tear right shoulder  The various methods of treatment have been discussed with the patient and family. After consideration of risks, benefits and other options for treatment, the patient has consented to  Procedure(s): ARTHROSCOPY SHOULDER as a surgical intervention .  The patients' history has been reviewed, patient examined, no change in status, stable for surgery.  I have reviewed the patients' chart and labs.  Questions were answered to the patient's satisfaction.     Velna Ochs  MD

## 2011-10-18 NOTE — Brief Op Note (Signed)
Jose [name] [id number] 10/18/2011   PRE-OP DIAGNOSIS: Right sh imp, RCT  POST-OP DIAGNOSIS: same  PROCEDURE: Right sh aply, clav, and RCR  ANESTHESIA: General and block  Valree Feild G   Dictation #:  [id number]

## 2011-10-18 NOTE — Anesthesia Postprocedure Evaluation (Signed)
  Anesthesia Post-op Note  Patient: Jose [name]  Procedure(s) Performed:  ARTHROSCOPY SHOULDER - right shoulder arthroscopy , acromioplasty, partial DCR and rotator cuff repair  Patient Location: PACU  Anesthesia Type: GA combined with regional for post-op pain  Level of Consciousness: awake  Airway and Oxygen Therapy: Patient Spontanous Breathing  Post-op Pain: mild  Post-op Assessment: Post-op Vital signs reviewed  Post-op Vital Signs: stable  Complications: No apparent anesthesia complications 

## 2011-10-18 NOTE — Anesthesia Procedure Notes (Addendum)
Anesthesia Regional Block:  Interscalene brachial plexus block  Pre-Anesthetic Checklist: ,, timeout performed, Correct Patient, Correct Site, Correct Laterality, Correct Procedure, Correct Position, site marked, Risks and benefits discussed, at surgeon's request and post-op pain management  Laterality: Upper  Prep: Betadine and alcohol swabs       Needles:  Injection technique: Single-shot  Needle Type: Stimulator Needle - 40      Needle Gauge: 22 and 22 G  Needle insertion depth: 2 cm   Additional Needles:  Procedures: nerve stimulator Interscalene brachial plexus block  Nerve Stimulator or Paresthesia:  Response: Twitch elicited, 0.5 mA, 0.3 ms,   Additional Responses:   Narrative:  Start time: 10/18/2011 9:30 AM End time: 10/18/2011 9:49 AM  Performed by: Personally   Additional Notes: Block assessed prior to start of surgery. Pt tolerated well.  Interscalene brachial plexus block Procedure Name: Intubation Performed by: Sharyne Richters Pre-anesthesia Checklist: Patient identified, Emergency Drugs available, Suction available, Patient being monitored and Timeout performed Patient Re-evaluated:Patient Re-evaluated prior to inductionOxygen Delivery Method: Circle System Utilized Preoxygenation: Pre-oxygenation with 100% oxygen Intubation Type: IV induction Ventilation: Mask ventilation without difficulty Grade View: Grade I Number of attempts: 1 Airway Equipment and Method: video-laryngoscopy Placement Confirmation: ETT inserted through vocal cords under direct vision,  positive ETCO2 and breath sounds checked- equal and bilateral Secured at: 22 cm Tube secured with: Tape Dental Injury: Teeth and Oropharynx as per pre-operative assessment

## 2011-10-18 NOTE — Anesthesia Postprocedure Evaluation (Signed)
  Anesthesia Post-op Note  Patient: Jose [name]  Procedure(s) Performed:  ARTHROSCOPY SHOULDER - right shoulder arthroscopy , acromioplasty, partial DCR and rotator cuff repair  Patient Location: PACU  Anesthesia Type: GA combined with regional for post-op pain  Level of Consciousness: awake  Airway and Oxygen Therapy: Patient Spontanous Breathing  Post-op Pain: mild  Post-op Assessment: Post-op Vital signs reviewed  Post-op Vital Signs: stable  Complications: No apparent anesthesia complications

## 2011-10-19 ENCOUNTER — Emergency Department (HOSPITAL_COMMUNITY): Payer: BC Managed Care – PPO

## 2011-10-19 ENCOUNTER — Encounter (HOSPITAL_COMMUNITY): Payer: Self-pay | Admitting: Emergency Medicine

## 2011-10-19 ENCOUNTER — Emergency Department (HOSPITAL_COMMUNITY)
Admission: EM | Admit: 2011-10-19 | Discharge: 2011-10-19 | Disposition: A | Discharge status: Home / Self Care | Payer: BC Managed Care – PPO | Attending: Emergency Medicine | Admitting: Emergency Medicine

## 2011-10-19 DIAGNOSIS — I10 Essential (primary) hypertension: Secondary | ICD-10-CM | POA: Insufficient documentation

## 2011-10-19 DIAGNOSIS — Z7982 Long term (current) use of aspirin: Secondary | ICD-10-CM | POA: Insufficient documentation

## 2011-10-19 DIAGNOSIS — J9819 Other pulmonary collapse: Secondary | ICD-10-CM | POA: Insufficient documentation

## 2011-10-19 DIAGNOSIS — R079 Chest pain, unspecified: Secondary | ICD-10-CM | POA: Insufficient documentation

## 2011-10-19 DIAGNOSIS — Z79899 Other long term (current) drug therapy: Secondary | ICD-10-CM | POA: Insufficient documentation

## 2011-10-19 DIAGNOSIS — R110 Nausea: Secondary | ICD-10-CM | POA: Insufficient documentation

## 2011-10-19 DIAGNOSIS — R05 Cough: Secondary | ICD-10-CM | POA: Insufficient documentation

## 2011-10-19 DIAGNOSIS — M549 Dorsalgia, unspecified: Secondary | ICD-10-CM | POA: Insufficient documentation

## 2011-10-19 DIAGNOSIS — J9811 Atelectasis: Secondary | ICD-10-CM

## 2011-10-19 DIAGNOSIS — G8918 Other acute postprocedural pain: Secondary | ICD-10-CM

## 2011-10-19 DIAGNOSIS — R059 Cough, unspecified: Secondary | ICD-10-CM | POA: Insufficient documentation

## 2011-10-19 LAB — DIFFERENTIAL
Basophils Relative: 0 % (ref 0–1)
Eosinophils Absolute: 0 10*3/uL (ref 0.0–0.7)
Eosinophils Relative: 0 % (ref 0–5)
Monocytes Absolute: 0.8 10*3/uL (ref 0.1–1.0)
Monocytes Relative: 4 % (ref 3–12)

## 2011-10-19 LAB — CBC
HCT: 44.6 % (ref 39.0–52.0)
Hemoglobin: 15.8 g/dL (ref 13.0–17.0)
MCH: 30.8 pg (ref 26.0–34.0)
MCHC: 35.4 g/dL (ref 30.0–36.0)
RDW: 13.2 % (ref 11.5–15.5)

## 2011-10-19 LAB — COMPREHENSIVE METABOLIC PANEL
Albumin: 4.2 g/dL (ref 3.5–5.2)
BUN: 20 mg/dL (ref 6–23)
Calcium: 9.8 mg/dL (ref 8.4–10.5)
Creatinine, Ser: 0.81 mg/dL (ref 0.50–1.35)
Total Bilirubin: 1.1 mg/dL (ref 0.3–1.2)
Total Protein: 7.4 g/dL (ref 6.0–8.3)

## 2011-10-19 LAB — POCT I-STAT TROPONIN I: Troponin i, poc: 0 ng/mL (ref 0.00–0.08)

## 2011-10-19 LAB — LIPASE, BLOOD: Lipase: 19 U/L (ref 11–59)

## 2011-10-19 MED ORDER — HYDROMORPHONE HCL PF 1 MG/ML IJ SOLN
1.0000 mg | Freq: Once | INTRAMUSCULAR | Status: AC
  Administered 2011-10-19: 1 mg via INTRAVENOUS
  Filled 2011-10-19: qty 1

## 2011-10-19 MED ORDER — ONDANSETRON HCL 4 MG/2ML IJ SOLN
INTRAMUSCULAR | Status: AC
  Filled 2011-10-19: qty 2

## 2011-10-19 MED ORDER — KETOROLAC TROMETHAMINE 60 MG/2ML IM SOLN
60.0000 mg | Freq: Once | INTRAMUSCULAR | Status: AC
  Administered 2011-10-19: 60 mg via INTRAMUSCULAR
  Filled 2011-10-19: qty 2

## 2011-10-19 MED ORDER — SODIUM CHLORIDE 0.9 % IV SOLN
INTRAVENOUS | Status: DC
  Administered 2011-10-19: 06:00:00 via INTRAVENOUS

## 2011-10-19 MED ORDER — ONDANSETRON 4 MG PO TBDP
ORAL_TABLET | ORAL | Status: AC
  Administered 2011-10-19: 8 mg via ORAL
  Filled 2011-10-19: qty 2

## 2011-10-19 MED ORDER — ONDANSETRON HCL 4 MG/2ML IJ SOLN
4.0000 mg | Freq: Once | INTRAMUSCULAR | Status: AC
  Administered 2011-10-19: 4 mg via INTRAVENOUS

## 2011-10-19 MED ORDER — ONDANSETRON 4 MG PO TBDP
8.0000 mg | ORAL_TABLET | Freq: Once | ORAL | Status: AC
  Administered 2011-10-19: 8 mg via ORAL

## 2011-10-19 MED ORDER — OXYCODONE-ACETAMINOPHEN 5-325 MG PO TABS: ORAL_TABLET | ORAL | Status: DC

## 2011-10-19 MED ORDER — CYCLOBENZAPRINE HCL 10 MG PO TABS
10.0000 mg | ORAL_TABLET | Freq: Once | ORAL | Status: AC
  Administered 2011-10-19: 10 mg via ORAL
  Filled 2011-10-19: qty 1

## 2011-10-19 NOTE — ED Notes (Signed)
Pt to ED after rotator cuff surgery today.  Immediately after rcvng nerve block he began experiencing sub-scapular pain.  Pain continued after pt awoke from surgery, despite nerve block and pain medications.  Pt also began experiencing increased pain on inspiration.  Lung sounds diminshed to R side and present to L.

## 2011-10-19 NOTE — ED Notes (Signed)
Pt has only been given 8mg  odt zofran.  Please disregard MAR which notes that zofran was given twice.

## 2011-10-19 NOTE — ED Notes (Signed)
Updated pt on care, dr ghim on phone with dr Yisroel Ramming.

## 2011-10-19 NOTE — ED Notes (Signed)
Pt moved to room 2.  Multiple attempts at sl.  States pain 5/10 before dilaudid given.  Await results of dilaudid.

## 2011-10-19 NOTE — ED Notes (Signed)
Pt d/c with prescription and incentive spirometer. Pt ambulated to d/c window with spouse. Voiced no complaints at this time. NAD noted.

## 2011-10-19 NOTE — ED Provider Notes (Signed)
History     CSN: [id number] Arrival date & time: 10/19/2011  1:00 AM   First MD Initiated Contact with Patient 10/19/11 0207      No chief complaint on file. right chest/back pain  (Consider location/radiation/quality/duration/timing/severity/associated sxs/prior treatment) HPI  Patient had rotator cuff surgery this morning by Dr. Yisroel Ramming. He relates he had a regional block done by Dr Boneta Lucks in the right side of his neck and about 10 minutes later started having pain in his right chest and right back area. He states the pain has continued and is not controlled by his pain medicines. He has a pleuritic component to the pain but denies feeling short of breath. He also states it hurts more to lay on his right side. He has a mild cough but denies sore throat or fever. He's had nausea without vomiting. He relates he was kept in postop area about 3 hours later than expected until about 4 PM when he was discharged and was rechecked by the anesthesiologist prior to discharge. He relates he called the office about 7 PM and spoke to the PA about the pain and tonight about 9 PM Dr. Yisroel Ramming  called him and he advised him to come to the ED. he states he's never had this pain before.  Primary care Dr. Kirby Funk  Past Medical History  Diagnosis Date  . Hypertension   . Hiatal hernia     Past Surgical History  Procedure Date  . Kidney stones   Rotator cuff surgery  History reviewed. No pertinent family history.  History  Substance Use Topics  . Smoking status: Never Smoker   . Smokeless tobacco: Not on file  . Alcohol Use: Yes   lives with spouse    Review of Systems  All other systems reviewed and are negative.    Allergies  Review of patient's allergies indicates no known allergies.  Home Medications   Current Outpatient Rx  Name Route Sig Dispense Refill  . ASPIRIN 81 MG PO TABS Oral Take 81 mg by mouth daily.      Marland Kitchen VITAMIN D 1000 UNITS PO TABS Oral Take 1,000 Units by  mouth daily.      Marland Kitchen CLOBETASOL PROPIONATE 0.05 % EX CREA Topical Apply topically 2 (two) times daily.      Marland Kitchen HYDROCODONE-ACETAMINOPHEN 5-325 MG PO TABS Oral Take 2 tablets by mouth every 6 (six) hours as needed for pain. 50 tablet 0  . OMEPRAZOLE MAGNESIUM 20 MG PO TBEC Oral Take 20 mg by mouth as needed.      Marland Kitchen VALSARTAN-HYDROCHLOROTHIAZIDE 160-12.5 MG PO TABS Oral Take 1 tablet by mouth daily.        BP 162/105  Pulse 92  Temp(Src) 99 F (37.2 C) (Oral)  Resp 20  SpO2 95% Vital signs hypertensive otherwise normal  Physical Exam  Vitals reviewed. Constitutional: He is oriented to person, place, and time. He appears well-developed and well-nourished.  HENT:  Head: Normocephalic and atraumatic.  Right Ear: External ear normal.  Left Ear: External ear normal.       Bases flushed which he states is normal  Eyes: Conjunctivae and EOM are normal. Pupils are equal, round, and reactive to light.  Neck: Normal range of motion. Neck supple.       No obvious needle marks seen however he has bandages from his rotator cuff surgery present  Cardiovascular: Normal rate, regular rhythm and normal heart sounds.   Pulmonary/Chest:       Patient has  no breath sounds on the right he has normal breath sounds on the left his rib cage is slightly tender to palpation.  Abdominal: Soft. Bowel sounds are normal. He exhibits no distension and no mass. There is no tenderness. There is no rebound and no guarding.  Musculoskeletal:       Patient is wearing a sling on the right upper extremity and he has a dressing on his right shoulder consistent with recent surgery  Neurological: He is alert and oriented to person, place, and time. He has normal reflexes.  Skin: Skin is warm and dry.  Psychiatric: He has a normal mood and affect. His behavior is normal.    ED Course  Procedures (including critical care time)   Medications  0.9 %  sodium chloride infusion (  Intravenous New Bag 10/19/11 0613)    HYDROmorphone (DILAUDID) injection 1 mg (not administered)  ondansetron (ZOFRAN) 4 MG/2ML injection (not administered)  ketorolac (TORADOL) injection 60 mg (60 mg Intramuscular Given 10/19/11 0424)  cyclobenzaprine (FLEXERIL) tablet 10 mg (10 mg Oral Given 10/19/11 0427)  ondansetron (ZOFRAN-ODT) disintegrating tablet 8 mg (8 mg Oral Given 10/19/11 0415)  ondansetron (ZOFRAN) injection 4 mg (4 mg Intravenous Given 10/19/11 0616)    Patient was given Toradol IM with oral Flexeril and Zofran ODT. He relates his pain was not changed. An IV was inserted he was given IV narcotics for pain.  Results for orders placed during the hospital encounter of 10/19/11  CBC      Component Value Range   WBC 18.6 (*) 4.0 - 10.5 (K/uL)   RBC 5.13  4.22 - 5.81 (MIL/uL)   Hemoglobin 15.8  13.0 - 17.0 (g/dL)   HCT 16.1  09.6 - 04.5 (%)   MCV 86.9  78.0 - 100.0 (fL)   MCH 30.8  26.0 - 34.0 (pg)   MCHC 35.4  30.0 - 36.0 (g/dL)   RDW 40.9  81.1 - 91.4 (%)   Platelets 161  150 - 400 (K/uL)  DIFFERENTIAL      Component Value Range   Neutrophils Relative 90 (*) 43 - 77 (%)   Neutro Abs 16.7 (*) 1.7 - 7.7 (K/uL)   Lymphocytes Relative 6 (*) 12 - 46 (%)   Lymphs Abs 1.1  0.7 - 4.0 (K/uL)   Monocytes Relative 4  3 - 12 (%)   Monocytes Absolute 0.8  0.1 - 1.0 (K/uL)   Eosinophils Relative 0  0 - 5 (%)   Eosinophils Absolute 0.0  0.0 - 0.7 (K/uL)   Basophils Relative 0  0 - 1 (%)   Basophils Absolute 0.0  0.0 - 0.1 (K/uL)  COMPREHENSIVE METABOLIC PANEL      Component Value Range   Sodium 136  135 - 145 (mEq/L)   Potassium 4.4  3.5 - 5.1 (mEq/L)   Chloride 100  96 - 112 (mEq/L)   CO2 24  19 - 32 (mEq/L)   Glucose, Bld 153 (*) 70 - 99 (mg/dL)   BUN 20  6 - 23 (mg/dL)   Creatinine, Ser 7.82  0.50 - 1.35 (mg/dL)   Calcium 9.8  8.4 - 95.6 (mg/dL)   Total Protein 7.4  6.0 - 8.3 (g/dL)   Albumin 4.2  3.5 - 5.2 (g/dL)   AST 33  0 - 37 (U/L)   ALT 48  0 - 53 (U/L)   Alkaline Phosphatase 88  39 - 117 (U/L)    Total Bilirubin 1.1  0.3 - 1.2 (mg/dL)   GFR  calc non Af Amer >90  >90 (mL/min)   GFR calc Af Amer >90  >90 (mL/min)  LIPASE, BLOOD      Component Value Range   Lipase 19  11 - 59 (U/L)  POCT I-STAT TROPONIN I      Component Value Range   Troponin i, poc 0.00  0.00 - 0.08 (ng/mL)   Comment 3            Laboratory interpretation leukocytosis and hyperglycemia otherwise normal.  Dg Chest 2 View  10/19/2011  *RADIOLOGY REPORT*  Clinical Data: Pleuritic chest pain and loss of right-sided breath sounds, status post rotator cuff surgery.  CHEST - 2 VIEW  Comparison: Chest radiograph performed 03/08/2011  Findings: There is elevation of the right hemidiaphragm, with associated right basilar atelectasis.  Mild left-sided atelectasis is also noted.  There is no evidence of pleural effusion or pneumothorax.  The heart is normal in size; the mediastinal contour is within normal limits.  No acute osseous abnormalities are seen.  Rounded densities overlying the right upper quadrant are likely outside the patient.  IMPRESSION: Elevation of the right hemidiaphragm, with mild bilateral atelectasis noted.  No evidence of pneumothorax.  Original Report Authenticated By: Tonia Ghent, M.D.   CT chest ordered   Date: 10/19/2011  Rate: 80  Rhythm: normal sinus rhythm  QRS Axis: normal  Intervals: normal  ST/T Wave abnormalities: normal  Conduction Disutrbances:none  Narrative Interpretation:   Old EKG Reviewed: unchanged from 06/23/2011   Impression Chest pain/Abdomen pain s/p regional block    Devoria Albe, MD, FACEP   MDM          Ward Givens, MD 10/19/11 [phone]

## 2011-10-19 NOTE — ED Notes (Signed)
Family at bedside. 

## 2011-10-19 NOTE — ED Notes (Signed)
Pt feels he is breathing easier d/t decreased pain.  Pain 4/10 after dilaudid.

## 2011-10-19 NOTE — ED Provider Notes (Signed)
  Physical Exam  BP 124/73  Pulse 74  Temp(Src) 97.8 F (36.6 C) (Oral)  Resp 17  SpO2 96%  Physical Exam  ED Course  Procedures  MDM Received pt in handoff from Dr. Lynelle Doctor.  Anesthesiologist repaged, still awaiting call back.  Pt has had non contrast CT scan per D.r Lynelle Doctor, awaiting result, looking for possible subtle PTX from recent procedure.  Pt requiring additional pain meds, 1 mg dilaudid redosed.      10:56 AM Pt feels improved.  CT scan shows some atelectasis, will give incentive spirometer.  Will change to percocet rather than vicodin that he is on now.  Pt has no further guarding, neg Murphy's sign, no fever.  Cough is improved.  Pt did vomit yesterday and so he may have aspirated some.  Pt's pain developed after regional block, hadn't been intubated yet.  So could be nerve complication as well.  Will need follow up with PCP and Dr. Jerl Santos.    Gavin Pound. Fidelia Cathers, MD 10/19/11 1100

## 2011-10-19 NOTE — ED Notes (Signed)
Pt  St's had rotator cuff repair surg this am  St's after receiving nerve block he developed pain in back,  Pt st's pain has not subsided

## 2011-10-19 NOTE — ED Notes (Signed)
Patient transported to CT 

## 2011-10-19 NOTE — Discharge Instructions (Signed)
 Atelectasis Atelectasis involves a collapse of the small air sacs in the lungs. It is a condition in which all or part of a lung or lungs collapse and become airless. Atelectasis may come on suddenly (acute). Or it may persist long-term (chronic). In acute atelectasis, the lung has recently collapsed. This is mainly significant only for its airlessness. Chronic atelectasis has the airlessness, too. But it also may have infection and scarring (fibrosis). There may also be a widening of the bronchi (bronchiectasis). Bronchiectasis is a disease of the lungs in which the structure of the tubes lining the lungs (bronchi) is distorted. This makes it difficult for bronchial secretions to be expelled. This leads to infections which are difficult to treat. There is often a persistent, productive cough. Smokers have a greater risk of developing all the lung problems. CAUSES  The most common cause of atelectasis is obstruction of a bronchus. The bronchi are the two large branches of the trachea which lead directly to the lungs. Smaller airways (bronchioles) can also become blocked. Obstructions may be caused by mucus plugs, tumors, or inhaled foreign bodies. Outside pressure from tumors, enlarged lymph nodes, or fluid (pleural effusion) may cause the problem. A collapse of a lung when air gets between the lung and the rib cage (pneumothorax) may also lead to atelectasis.  This happens because of the following mechanism:   When an airway becomes blocked, the air in the small air sacs of the lung (alveoli) beyond the blockage is absorbed into the bloodstream. This causes the alveoli to collapse. After a little time, the collapsed lung tissue may fill with blood cells, serum, and mucus. This abnormal collection of material is likely to become infected.   Acute atelectasis is a common problem after surgery. During an operation, portions of the lung may not expand as well as usual. This leads to collapse of the small air  sacs. Injuries can cause a similar collapse. Medications which depress breathing (respiration) may also lead to atelectasis. Anything that decreases the size of breaths taken in can cause atelectasis. This could be pain, a tight bandage around the chest, muscle weakness, nerve problems, or broken ribs.   Any of the causes of acute atelectasis may persist and become chronic. When they become chronic; infection, scarring, and other problems are likely to follow. When they are severe enough, shortness of breath and heart problems may follow.  SYMPTOMS   The loss of working lung tissue leads to shortness of breath. This decreases the blood oxygen level. That makes the heart work harder and beat faster. There may also be cyanosis.  That is a blue color to your nail beds and mucous membranes, such as your lips and mouth.   The severity of symptoms depends on how rapidly this happens and over how much time.   The symptoms are not as bad if the problems come on slowly over a long period of time. This is because your body has time to adapt.   A sudden severe change can result in loss of blood pressure (shock) and even death.  DIAGNOSIS  Your caregiver may suspect atelectasis based on your symptoms and physical findings. A chest X-ray may confirm their suspicions. Blood work and more specialized X-rays are sometimes required.  TREATMENT   The main treatment for sudden atelectasis is correction of the underlying cause. A blockage that cannot be removed by coughing or by suctioning the airways often can be removed by bronchoscopy. Bronchoscopy is like looking into the lung  with a slender telescope. It can be used to take biopsies or to remove a foreign body.   Antibiotics are given for infection. Chronic atelectasis often is treated with antibiotics because infection is almost expected.   Sometimes affected lung parts are surgically removed. This is done if:   Recurring infections become disabling.    Bleeding is significant.   The problems persist despite treatment.   If a tumor is blocking the airway, relieving the obstruction by surgery, radiation therapy, or chemotherapy may prevent the condition from progressing to pneumonia.   Atelectasis due to deficient or ineffective surfactant is directed at treating the low blood oxygen. This is often done with mechanical ventilation. Surfactant drugs are lifesaving for premature babies that are deficient. This therapy is experimental in adults with the acute respiratory distress and reduced surfactant activity.  Possible complications include:  Pneumonia.   Germs in the blood stream (sepsis).   Bronchiectasis.   Fluid accumulating in the lungs.   Lung failure.   This may lead to infection.  Most causes of atelectasis are easily treated and do well. Those caused by a tumor will depend on the ease of treating that tumor. In some cases it is not possible to determine the cause of atelectasis early on. So it is important to follow up with your caregiver. PREVENTION   Stop smoking. This is especially necessary before or following surgery.   Following a surgery:   Breathe deeply.   Cough regularly.   Move around as soon as possible.   Using breathing devices to encourage deep breathing and exercises including changing positions to help drainage of secretions may help prevent atelectasis.   Chest deformities and neurologic conditions causing shallow breathing may benefit from mechanical devices to help breathing. One method is continuous positive airway pressure. These machines deliver oxygen through a nose or face mask. They make sure the airways do not collapse. They do this by keeping a positive pressure in the airway. This stays positive even at the end of a breath. Sometimes additional respiratory support is needed with a mechanical ventilator.  SEEK IMMEDIATE MEDICAL CARE IF:   You develop increasing problems with your  breathing.   You develop a fever over 102 F (38.9 C).   You develop severe chest pain.   You develop severe coughing or cough up any blood.  Document Released: 11/21/2005 Document Revised: 08/03/2011 Document Reviewed: 04/05/2007 [facility] Patient Information 2012 [place], [place].Atelectasis Atelectasis involves a collapse of the small air sacs in the lungs. It is a condition in which all or part of a lung or lungs collapse and become airless. Atelectasis may come on suddenly (acute). Or it may persist long-term (chronic). In acute atelectasis, the lung has recently collapsed. This is mainly significant only for its airlessness. Chronic atelectasis has the airlessness, too. But it also may have infection and scarring (fibrosis). There may also be a widening of the bronchi (bronchiectasis). Bronchiectasis is a disease of the lungs in which the structure of the tubes lining the lungs (bronchi) is distorted. This makes it difficult for bronchial secretions to be expelled. This leads to infections which are difficult to treat. There is often a persistent, productive cough. Smokers have a greater risk of developing all the lung problems. CAUSES  The most common cause of atelectasis is obstruction of a bronchus. The bronchi are the two large branches of the trachea which lead directly to the lungs. Smaller airways (bronchioles) can also become blocked. Obstructions may be caused  by mucus plugs, tumors, or inhaled foreign bodies. Outside pressure from tumors, enlarged lymph nodes, or fluid (pleural effusion) may cause the problem. A collapse of a lung when air gets between the lung and the rib cage (pneumothorax) may also lead to atelectasis.  This happens because of the following mechanism:   When an airway becomes blocked, the air in the small air sacs of the lung (alveoli) beyond the blockage is absorbed into the bloodstream. This causes the alveoli to collapse. After a little time, the collapsed lung  tissue may fill with blood cells, serum, and mucus. This abnormal collection of material is likely to become infected.   Acute atelectasis is a common problem after surgery. During an operation, portions of the lung may not expand as well as usual. This leads to collapse of the small air sacs. Injuries can cause a similar collapse. Medications which depress breathing (respiration) may also lead to atelectasis. Anything that decreases the size of breaths taken in can cause atelectasis. This could be pain, a tight bandage around the chest, muscle weakness, nerve problems, or broken ribs.   Any of the causes of acute atelectasis may persist and become chronic. When they become chronic; infection, scarring, and other problems are likely to follow. When they are severe enough, shortness of breath and heart problems may follow.  SYMPTOMS   The loss of working lung tissue leads to shortness of breath. This decreases the blood oxygen level. That makes the heart work harder and beat faster. There may also be cyanosis.  That is a blue color to your nail beds and mucous membranes, such as your lips and mouth.   The severity of symptoms depends on how rapidly this happens and over how much time.   The symptoms are not as bad if the problems come on slowly over a long period of time. This is because your body has time to adapt.   A sudden severe change can result in loss of blood pressure (shock) and even death.  DIAGNOSIS  Your caregiver may suspect atelectasis based on your symptoms and physical findings. A chest X-ray may confirm their suspicions. Blood work and more specialized X-rays are sometimes required.  TREATMENT   The main treatment for sudden atelectasis is correction of the underlying cause. A blockage that cannot be removed by coughing or by suctioning the airways often can be removed by bronchoscopy. Bronchoscopy is like looking into the lung with a slender telescope. It can be used to take  biopsies or to remove a foreign body.   Antibiotics are given for infection. Chronic atelectasis often is treated with antibiotics because infection is almost expected.   Sometimes affected lung parts are surgically removed. This is done if:   Recurring infections become disabling.   Bleeding is significant.   The problems persist despite treatment.   If a tumor is blocking the airway, relieving the obstruction by surgery, radiation therapy, or chemotherapy may prevent the condition from progressing to pneumonia.   Atelectasis due to deficient or ineffective surfactant is directed at treating the low blood oxygen. This is often done with mechanical ventilation. Surfactant drugs are lifesaving for premature babies that are deficient. This therapy is experimental in adults with the acute respiratory distress and reduced surfactant activity.  Possible complications include:  Pneumonia.   Germs in the blood stream (sepsis).   Bronchiectasis.   Fluid accumulating in the lungs.   Lung failure.   This may lead to infection.  Most causes  of atelectasis are easily treated and do well. Those caused by a tumor will depend on the ease of treating that tumor. In some cases it is not possible to determine the cause of atelectasis early on. So it is important to follow up with your caregiver. PREVENTION   Stop smoking. This is especially necessary before or following surgery.   Following a surgery:   Breathe deeply.   Cough regularly.   Move around as soon as possible.   Using breathing devices to encourage deep breathing and exercises including changing positions to help drainage of secretions may help prevent atelectasis.   Chest deformities and neurologic conditions causing shallow breathing may benefit from mechanical devices to help breathing. One method is continuous positive airway pressure. These machines deliver oxygen through a nose or face mask. They make sure the airways do  not collapse. They do this by keeping a positive pressure in the airway. This stays positive even at the end of a breath. Sometimes additional respiratory support is needed with a mechanical ventilator.  SEEK IMMEDIATE MEDICAL CARE IF:   You develop increasing problems with your breathing.   You develop a fever over 102 F (38.9 C).   You develop severe chest pain.   You develop severe coughing or cough up any blood.  Document Released: 11/21/2005 Document Revised: 08/03/2011 Document Reviewed: 04/05/2007 [facility] Patient Information 2012 [place], [place].

## 2011-10-19 NOTE — ED Notes (Signed)
Instructed pt on incentive spirometry. Pt demonstrated 3x

## 2011-10-19 NOTE — ED Notes (Signed)
Care assumed, introduced self to patient, Patient on stretcher, NAD noted, Resp e/u, ABC intact. Updated on POC, Will continue to monitor.

## 2011-10-25 ENCOUNTER — Encounter (HOSPITAL_BASED_OUTPATIENT_CLINIC_OR_DEPARTMENT_OTHER): Payer: Self-pay | Admitting: Orthopaedic Surgery

## 2012-12-06 ENCOUNTER — Other Ambulatory Visit: Payer: Self-pay | Admitting: Gastroenterology

## 2012-12-06 DIAGNOSIS — R1011 Right upper quadrant pain: Secondary | ICD-10-CM

## 2012-12-10 ENCOUNTER — Ambulatory Visit
Admission: RE | Admit: 2012-12-10 | Discharge: 2012-12-10 | Disposition: A | Discharge status: Home / Self Care | Payer: BC Managed Care – PPO | Source: Ambulatory Visit | Attending: Gastroenterology | Admitting: Gastroenterology

## 2012-12-10 DIAGNOSIS — R1011 Right upper quadrant pain: Secondary | ICD-10-CM

## 2012-12-12 ENCOUNTER — Other Ambulatory Visit: Payer: Self-pay | Admitting: Gastroenterology

## 2012-12-12 DIAGNOSIS — R109 Unspecified abdominal pain: Secondary | ICD-10-CM

## 2012-12-17 ENCOUNTER — Other Ambulatory Visit: Payer: Self-pay | Admitting: Gastroenterology

## 2012-12-17 DIAGNOSIS — Z139 Encounter for screening, unspecified: Secondary | ICD-10-CM

## 2012-12-18 ENCOUNTER — Ambulatory Visit
Admission: RE | Admit: 2012-12-18 | Discharge: 2012-12-18 | Disposition: A | Discharge status: Home / Self Care | Payer: BC Managed Care – PPO | Source: Ambulatory Visit | Attending: Gastroenterology | Admitting: Gastroenterology

## 2012-12-18 ENCOUNTER — Other Ambulatory Visit: Payer: Self-pay | Admitting: Gastroenterology

## 2012-12-18 DIAGNOSIS — R109 Unspecified abdominal pain: Secondary | ICD-10-CM

## 2012-12-18 DIAGNOSIS — Z139 Encounter for screening, unspecified: Secondary | ICD-10-CM

## 2012-12-18 MED ORDER — GADOBENATE DIMEGLUMINE 529 MG/ML IV SOLN
19.0000 mL | Freq: Once | INTRAVENOUS | Status: AC | PRN
  Administered 2012-12-18: 19 mL via INTRAVENOUS

## 2012-12-19 ENCOUNTER — Other Ambulatory Visit: Payer: BC Managed Care – PPO

## 2012-12-31 ENCOUNTER — Encounter (HOSPITAL_COMMUNITY): Payer: Self-pay | Admitting: *Deleted

## 2012-12-31 NOTE — Pre-Procedure Instructions (Addendum)
Your procedure is scheduled [id number], January 11, 2013 Report to [facility] Admitting [id number] Call this number if you have problems morning of your procedure:[phone]  Follow all bowel prep instructions per your doctor's orders.  Do not eat or drink anything after midnight the night before your procedure. You may brush your teeth, rinse out your mouth, but no water, no food, no chewing gum, no mints, no candies, no chewing tobacco.     Take these medicines the morning of your procedure with A SIP OF WATER:Diovan and Prilosec if needed  Please make arrangements for a responsible person to drive you home after the procedure. You cannot go home by cab/taxi. We recommend you have someone with you at home the first 24 hours after your procedure. Driver for procedure is wife Bosie Clos [phone]  LEAVE ALL VALUABLES, JEWELRY, BILLFOLD AT HOME.  NO DENTURES, CONTACT LENSES ALLOWED IN THE ENDOSCOPY ROOM.   YOU MAY WEAR DEODORANT, PLEASE REMOVE ALL JEWELRY, WATCHES RINGS, BODY PIERCINGS AND LEAVE AT HOME.   WOMEN: NO MAKE-UP, LOTIONS PERFUMES

## 2013-01-02 ENCOUNTER — Encounter (HOSPITAL_COMMUNITY): Payer: Self-pay | Admitting: Pharmacy Technician

## 2013-01-11 ENCOUNTER — Ambulatory Visit (HOSPITAL_COMMUNITY): Payer: BC Managed Care – PPO | Admitting: Anesthesiology

## 2013-01-11 ENCOUNTER — Encounter (HOSPITAL_COMMUNITY): Payer: Self-pay | Admitting: Anesthesiology

## 2013-01-11 ENCOUNTER — Ambulatory Visit (HOSPITAL_COMMUNITY)
Admission: RE | Admit: 2013-01-11 | Discharge: 2013-01-11 | Disposition: A | Discharge status: Home / Self Care | Payer: BC Managed Care – PPO | Source: Ambulatory Visit | Attending: Gastroenterology | Admitting: Gastroenterology

## 2013-01-11 ENCOUNTER — Encounter (HOSPITAL_COMMUNITY)
Admission: RE | Disposition: A | Discharge status: Home / Self Care | Payer: Self-pay | Source: Ambulatory Visit | Attending: Gastroenterology

## 2013-01-11 ENCOUNTER — Encounter (HOSPITAL_COMMUNITY): Payer: Self-pay | Admitting: *Deleted

## 2013-01-11 DIAGNOSIS — G8929 Other chronic pain: Secondary | ICD-10-CM | POA: Insufficient documentation

## 2013-01-11 DIAGNOSIS — R7989 Other specified abnormal findings of blood chemistry: Secondary | ICD-10-CM | POA: Insufficient documentation

## 2013-01-11 DIAGNOSIS — R1011 Right upper quadrant pain: Secondary | ICD-10-CM | POA: Insufficient documentation

## 2013-01-11 DIAGNOSIS — R933 Abnormal findings on diagnostic imaging of other parts of digestive tract: Secondary | ICD-10-CM | POA: Insufficient documentation

## 2013-01-11 DIAGNOSIS — K7689 Other specified diseases of liver: Secondary | ICD-10-CM | POA: Insufficient documentation

## 2013-01-11 HISTORY — PX: EUS: SHX5427

## 2013-01-11 HISTORY — DX: Sleep apnea, unspecified: G47.30

## 2013-01-11 HISTORY — DX: Gastro-esophageal reflux disease without esophagitis: K21.9

## 2013-01-11 SURGERY — ESOPHAGEAL ENDOSCOPIC ULTRASOUND (EUS) RADIAL
Anesthesia: General

## 2013-01-11 MED ORDER — METOCLOPRAMIDE HCL 5 MG/ML IJ SOLN: 10.0000 mg | Freq: Once | INTRAMUSCULAR | Status: DC | PRN

## 2013-01-11 MED ORDER — FENTANYL CITRATE 0.05 MG/ML IJ SOLN
INTRAMUSCULAR | Status: DC | PRN
  Administered 2013-01-11: 100 ug via INTRAVENOUS

## 2013-01-11 MED ORDER — SODIUM CHLORIDE 0.9 % IV SOLN: INTRAVENOUS | Status: DC

## 2013-01-11 MED ORDER — DICYCLOMINE HCL 10 MG PO CAPS: 10.0000 mg | ORAL_CAPSULE | Freq: Three times a day (TID) | ORAL | Status: DC

## 2013-01-11 MED ORDER — PROPOFOL INFUSION 10 MG/ML OPTIME
INTRAVENOUS | Status: DC | PRN
  Administered 2013-01-11: 140 ug/kg/min via INTRAVENOUS

## 2013-01-11 MED ORDER — MIDAZOLAM HCL 5 MG/5ML IJ SOLN
INTRAMUSCULAR | Status: DC | PRN
  Administered 2013-01-11 (×2): 1 mg via INTRAVENOUS

## 2013-01-11 MED ORDER — LACTATED RINGERS IV SOLN
INTRAVENOUS | Status: DC
  Administered 2013-01-11: 1000 mL via INTRAVENOUS

## 2013-01-11 NOTE — H&P (Signed)
Patient interval history reviewed.  Patient examined again.  There has been no change from documented H/P dated 12/12/12 and 12/26/12 (scanned into chart from our office) except as documented above.  Assessment:  1.  Chronic right upper quadrant abdominal pain, extensive evaluation.  Recent MRI/MRCP with distal bile duct blunting of unclear significance.  Symptoms do not sound hepatobiliary in nature. 2.  Elevated LFTs, chronic, likely due to hepatic steatosis.  Plan:  1.  Endoscopic ultrasound; ERCP to follow only if choledocholithiasis is seen. 2.  Risks (bleeding, infection, bowel perforation that could require surgery, sedation-related changes in cardiopulmonary systems), benefits (identification and possible treatment of source of symptoms, exclusion of certain causes of symptoms), and alternatives (watchful waiting, radiographic imaging studies, empiric medical treatment) of upper endoscopy with ultrasound (EUS) were explained to patient and patient wishes to proceed. 3.  Risks (up to and including bleeding, infection, perforation, pancreatitis that can be complicated by infected necrosis and death), benefits (removal of stones, alleviating blockage, decreasing risk of cholangitis or choledocholithiasis-related pancreatitis), and alternatives (watchful waiting, percutaneous transhepatic cholangiography) of ERCP were explained to patient and elects to proceed.

## 2013-01-11 NOTE — Anesthesia Preprocedure Evaluation (Addendum)
Anesthesia Evaluation  Patient identified by MRN, date of birth, ID band Patient awake    Reviewed: Allergy & Precautions, H&P , NPO status , Patient's Chart, lab work & pertinent test results, reviewed documented beta blocker date and time   Airway Mallampati: II TM Distance: >3 FB Neck ROM: full    Dental   Pulmonary sleep apnea ,  breath sounds clear to auscultation        Cardiovascular hypertension, On Medications Rhythm:regular     Neuro/Psych  Neuromuscular disease negative psych ROS   GI/Hepatic negative GI ROS, Neg liver ROS, hiatal hernia, GERD-  Medicated and Controlled,  Endo/Other  negative endocrine ROS  Renal/GU Renal disease  negative genitourinary   Musculoskeletal   Abdominal   Peds  Hematology negative hematology ROS (+)   Anesthesia Other Findings See surgeon's H&P   Reproductive/Obstetrics negative OB ROS                           Anesthesia Physical Anesthesia Plan  ASA: III  Anesthesia Plan: MAC   Post-op Pain Management:    Induction: Intravenous  Airway Management Planned: Simple Face Mask and Nasal Cannula  Additional Equipment:   Intra-op Plan:   Post-operative Plan:   Informed Consent: I have reviewed the patients History and Physical, chart, labs and discussed the procedure including the risks, benefits and alternatives for the proposed anesthesia with the patient or authorized representative who has indicated his/her understanding and acceptance.   Dental Advisory Given  Plan Discussed with: CRNA and Surgeon  Anesthesia Plan Comments:        Anesthesia Quick Evaluation

## 2013-01-11 NOTE — Anesthesia Postprocedure Evaluation (Signed)
Anesthesia Post Note  Patient: Jose [name]  Procedure(s) Performed: Procedure(s) (LRB): ESOPHAGEAL ENDOSCOPIC ULTRASOUND (EUS) RADIAL (N/A) ENDOSCOPIC RETROGRADE CHOLANGIOPANCREATOGRAPHY (ERCP) (N/A)  Anesthesia type: MAC  Patient location: PACU  Post pain: Pain level controlled  Post assessment: Patient's Cardiovascular Status Stable  Last Vitals:  Filed Vitals:   01/11/13 1035  BP: 147/76  Pulse:   Temp: 36.6 C  Resp: 7    Post vital signs: Reviewed and stable  Level of consciousness: alert  Complications: No apparent anesthesia complications

## 2013-01-11 NOTE — Op Note (Signed)
[facility] [address] [place], [address]   ENDOSCOPIC ULTRASOUND PROCEDURE REPORT  PATIENT: Jose [name], Jose [name]  MR#: [id number] BIRTHDATE: [birth year]  GENDER: Male ENDOSCOPIST: Willis Modena, MD REFERRED BY:  Kirby Funk, M.D.  Danise Edge, M.D. PROCEDURE DATE:  01/11/2013 PROCEDURE:   Upper EUS ASA CLASS:      Class III INDICATIONS:   1.  abdominal pain in upper right quadrant.   2. abnormal liver function test.   3.  abnormal MRI of the GI tract. MEDICATIONS: MAC sedation, administered by CRNA and Cetacaine spray x 2  DESCRIPTION OF PROCEDURE:   After the risks benefits and alternatives of the procedure were  explained, informed consent was obtained. The patient was then placed in the left, lateral, decubitus postion and IV sedation was administered. Throughout the procedure, the patients blood pressure, pulse and oxygen saturations were monitored continuously.  Under direct visualization, the Pentax EUS Radial [id number]  endoscope was introduced through the mouth  and advanced to the second portion of the duodenum .  Water was used as necessary to provide an acoustic interface.  Upon completion of the imaging, water was removed and the patient was sent to the recovery room in satisfactory condition.    FINDINGS:      Pancreas had mild diffuse lobularity; appearance suggestive but not pathognomonic for early chronic pancreatitis. Pancreas otherwise normal without mass, cyst or peripancreatic adenopathy.  Bile duct normal caliber; no wall thickening and no bile duct stones were seen.  Ampulla normal both endoscopically and ultrasonographically.  Diffusely fatty liver.  Gallbladder was normal-appearing without wall thickening, stones, or sludge.  IMPRESSION:     As above.  Possible early chronic pancreatitis, which may or may not explain some of his lingering right upper quadrant pain symptoms.  No ampullary or extrahepatic biliary tract abnormality  identified.  RECOMMENDATIONS:     1.  Watch for potential complications of procedure. 2.  Trial of dicyclomine (10 mg by mouth before meals and at night scheduled x 3 days, then prn thereafter). 3.  Will discuss case with Dr. Laural Benes.   _______________________________ Rosalie DoctorWillis Modena, MD 01/11/2013 10:48 AM   CC:

## 2013-01-11 NOTE — Transfer of Care (Signed)
Immediate Anesthesia Transfer of Care Note  Patient: Jose [name]  Procedure(s) Performed: Procedure(s) (LRB) with comments: ESOPHAGEAL ENDOSCOPIC ULTRASOUND (EUS) RADIAL (N/A) - radial scope  will bring h&p ENDOSCOPIC RETROGRADE CHOLANGIOPANCREATOGRAPHY (ERCP) (N/A)  Patient Location: PACU and Endoscopy Unit  Anesthesia Type:MAC  Level of Consciousness: awake, alert , oriented and patient cooperative  Airway & Oxygen Therapy: Patient Spontanous Breathing and Patient connected to nasal cannula oxygen  Post-op Assessment: Report given to PACU RN and Post -op Vital signs reviewed and stable  Post vital signs: Reviewed and stable  Complications: No apparent anesthesia complications

## 2013-01-14 ENCOUNTER — Encounter (HOSPITAL_COMMUNITY): Payer: Self-pay | Admitting: Gastroenterology

## 2013-05-08 ENCOUNTER — Emergency Department (HOSPITAL_COMMUNITY)
Admission: EM | Admit: 2013-05-08 | Discharge: 2013-05-08 | Disposition: A | Discharge status: Home / Self Care | Payer: BC Managed Care – PPO | Attending: Emergency Medicine | Admitting: Emergency Medicine

## 2013-05-08 ENCOUNTER — Encounter (HOSPITAL_COMMUNITY): Payer: Self-pay | Admitting: Emergency Medicine

## 2013-05-08 ENCOUNTER — Emergency Department (HOSPITAL_COMMUNITY): Payer: BC Managed Care – PPO

## 2013-05-08 DIAGNOSIS — N189 Chronic kidney disease, unspecified: Secondary | ICD-10-CM | POA: Insufficient documentation

## 2013-05-08 DIAGNOSIS — Z79899 Other long term (current) drug therapy: Secondary | ICD-10-CM | POA: Insufficient documentation

## 2013-05-08 DIAGNOSIS — IMO0001 Reserved for inherently not codable concepts without codable children: Secondary | ICD-10-CM | POA: Insufficient documentation

## 2013-05-08 DIAGNOSIS — R238 Other skin changes: Secondary | ICD-10-CM | POA: Insufficient documentation

## 2013-05-08 DIAGNOSIS — Y939 Activity, unspecified: Secondary | ICD-10-CM | POA: Insufficient documentation

## 2013-05-08 DIAGNOSIS — Z880 Allergy status to penicillin: Secondary | ICD-10-CM | POA: Insufficient documentation

## 2013-05-08 DIAGNOSIS — R231 Pallor: Secondary | ICD-10-CM

## 2013-05-08 DIAGNOSIS — Y929 Unspecified place or not applicable: Secondary | ICD-10-CM | POA: Insufficient documentation

## 2013-05-08 DIAGNOSIS — T148 Other injury of unspecified body region: Secondary | ICD-10-CM | POA: Insufficient documentation

## 2013-05-08 DIAGNOSIS — Z87442 Personal history of urinary calculi: Secondary | ICD-10-CM | POA: Insufficient documentation

## 2013-05-08 DIAGNOSIS — R197 Diarrhea, unspecified: Secondary | ICD-10-CM | POA: Insufficient documentation

## 2013-05-08 DIAGNOSIS — R079 Chest pain, unspecified: Secondary | ICD-10-CM | POA: Insufficient documentation

## 2013-05-08 DIAGNOSIS — Z8719 Personal history of other diseases of the digestive system: Secondary | ICD-10-CM | POA: Insufficient documentation

## 2013-05-08 DIAGNOSIS — Z7982 Long term (current) use of aspirin: Secondary | ICD-10-CM | POA: Insufficient documentation

## 2013-05-08 DIAGNOSIS — I129 Hypertensive chronic kidney disease with stage 1 through stage 4 chronic kidney disease, or unspecified chronic kidney disease: Secondary | ICD-10-CM | POA: Insufficient documentation

## 2013-05-08 DIAGNOSIS — R52 Pain, unspecified: Secondary | ICD-10-CM

## 2013-05-08 DIAGNOSIS — K219 Gastro-esophageal reflux disease without esophagitis: Secondary | ICD-10-CM | POA: Insufficient documentation

## 2013-05-08 DIAGNOSIS — W57XXXA Bitten or stung by nonvenomous insect and other nonvenomous arthropods, initial encounter: Secondary | ICD-10-CM | POA: Insufficient documentation

## 2013-05-08 DIAGNOSIS — G4730 Sleep apnea, unspecified: Secondary | ICD-10-CM | POA: Insufficient documentation

## 2013-05-08 LAB — CBC
HCT: 46.2 % (ref 39.0–52.0)
Hemoglobin: 16.6 g/dL (ref 13.0–17.0)
MCH: 31.1 pg (ref 26.0–34.0)
MCHC: 35.9 g/dL (ref 30.0–36.0)
MCV: 86.7 fL (ref 78.0–100.0)
Platelets: 127 K/uL — ABNORMAL LOW (ref 150–400)
RBC: 5.33 MIL/uL (ref 4.22–5.81)
RDW: 13.4 % (ref 11.5–15.5)
WBC: 9.1 K/uL (ref 4.0–10.5)

## 2013-05-08 LAB — URINALYSIS, ROUTINE W REFLEX MICROSCOPIC
Bilirubin Urine: NEGATIVE
Glucose, UA: NEGATIVE mg/dL
Hgb urine dipstick: NEGATIVE
Ketones, ur: NEGATIVE mg/dL
Leukocytes, UA: NEGATIVE
Nitrite: NEGATIVE
Protein, ur: NEGATIVE mg/dL
Specific Gravity, Urine: 1.022 (ref 1.005–1.030)
Urobilinogen, UA: 0.2 mg/dL (ref 0.0–1.0)
pH: 5.5 (ref 5.0–8.0)

## 2013-05-08 LAB — COMPREHENSIVE METABOLIC PANEL
ALT: 81 U/L — ABNORMAL HIGH (ref 0–53)
Alkaline Phosphatase: 97 U/L (ref 39–117)
CO2: 26 mEq/L (ref 19–32)
Calcium: 9.9 mg/dL (ref 8.4–10.5)
Chloride: 104 mEq/L (ref 96–112)
GFR calc Af Amer: >90 mL/min (ref >90)
GFR calc non Af Amer: >90 mL/min (ref >90)
Glucose, Bld: 113 mg/dL — ABNORMAL HIGH (ref 70–99)
Potassium: 4 mEq/L (ref 3.5–5.1)
Sodium: 138 mEq/L (ref 135–145)
Total Bilirubin: 1.7 mg/dL — ABNORMAL HIGH (ref 0.3–1.2)

## 2013-05-08 LAB — TROPONIN I: Troponin I: <0.3 ng/mL (ref <0.30)

## 2013-05-08 MED ORDER — DOXYCYCLINE HYCLATE 100 MG PO CAPS: 100.0000 mg | ORAL_CAPSULE | Freq: Two times a day (BID) | ORAL | Status: DC

## 2013-05-08 NOTE — ED Provider Notes (Signed)
History     CSN: [id number]  Arrival date & time 05/08/13  4098   First MD Initiated Contact with Patient 05/08/13 1023      Chief Complaint  Patient presents with  . Chest Pain    (Consider location/radiation/quality/duration/timing/severity/associated sxs/prior treatment) Patient is a 57 y.o. male presenting with chest pain. The history is provided by the patient.  Chest Pain Associated symptoms: no abdominal pain, no back pain, no cough, no headache, no palpitations, no shortness of breath and not vomiting   pt c/o feeling clammy, achy since last evening. States gets a focal dull pain in chest that will last several seconds, at rest, unassociated w any other symptoms. No dyspnea. No diaphoresis. States felt lousy, as if had fever for past day.  No chills/sweats. Denies any recent exertional cp or discomfort. No unusual doe or fatigue. No pleuritic pain. No leg pain or swelling. No personal hx chf, cad, pe.  States father developed cad/stents in his 56s. No family hx premature cad. Hx htn. No hx high chol, or diabetes. Not a smoker. Pt does not had tick bite 1-2 weeks ago, around beltline. Denies rash/lesion. Pt did have couple episodes diarrhea in past day, no abd pain or nv.   Past Medical History  Diagnosis Date  . Hypertension   . GERD (gastroesophageal reflux disease)   . Hiatal hernia   . Chronic kidney disease     kidney stones  . Sleep apnea     Asked to bring mask and tubing for procedure    Past Surgical History  Procedure Laterality Date  . Kid stones    . Shoulder arthroscopy  10/18/2011    Procedure: ARTHROSCOPY SHOULDER;  Surgeon: Velna Ochs, MD;  Location: [facility];  Service: Orthopedics;  Laterality: Right;  right shoulder arthroscopy , acromioplasty, partial DCR and rotator cuff repair  . Lithotripsy    . Eus N/A 01/11/2013    Procedure: ESOPHAGEAL ENDOSCOPIC ULTRASOUND (EUS) RADIAL;  Surgeon: Willis Modena, MD;  Location: [facility];   Service: Endoscopy;  Laterality: N/A;  radial scope  will bring h&p    History reviewed. No pertinent family history.  History  Substance Use Topics  . Smoking status: Never Smoker   . Smokeless tobacco: Never Used  . Alcohol Use: Yes     Comment: Rarely      Review of Systems  Constitutional: Negative for chills.  HENT: Negative for sore throat, rhinorrhea, neck pain and neck stiffness.   Eyes: Negative for redness.  Respiratory: Negative for cough and shortness of breath.   Cardiovascular: Positive for chest pain. Negative for palpitations and leg swelling.  Gastrointestinal: Positive for diarrhea. Negative for vomiting and abdominal pain.  Genitourinary: Negative for dysuria and flank pain.  Musculoskeletal: Negative for back pain.  Skin: Negative for rash.  Neurological: Negative for headaches.  Hematological: Negative for adenopathy.  Psychiatric/Behavioral: Negative for confusion.    Allergies  Lisinopril; Sulfa antibiotics; and Penicillins  Home Medications   Current Outpatient Rx  Name  Route  Sig  Dispense  Refill  . aspirin 81 MG tablet   Oral   Take 81 mg by mouth daily.           . cholecalciferol (VITAMIN D) 1000 UNITS tablet   Oral   Take 1,000 Units by mouth daily.           . clobetasol (TEMOVATE) 0.05 % external solution   Topical   Apply 1 application topically daily  as needed.         . fluticasone (CUTIVATE) 0.05 % cream   Topical   Apply topically daily.         . meloxicam (MOBIC) 15 MG tablet   Oral   Take 15 mg by mouth daily.         Marland Kitchen omeprazole (PRILOSEC OTC) 20 MG tablet   Oral   Take 20 mg by mouth daily.          . valsartan (DIOVAN) 160 MG tablet   Oral   Take 160 mg by mouth daily before breakfast.           BP 162/87  Pulse 82  Temp(Src) 97.7 F (36.5 C) (Oral)  Resp 18  SpO2 96%  Physical Exam  Nursing note and vitals reviewed. Constitutional: He is oriented to person, place, and time. He  appears well-developed and well-nourished. No distress.  HENT:  Nose: Nose normal.  Mouth/Throat: Oropharynx is clear and moist.  Eyes: Conjunctivae are normal. No scleral icterus.  Neck: Neck supple. No tracheal deviation present.  No stiffness or rigidity  Cardiovascular: Normal rate, regular rhythm, normal heart sounds and intact distal pulses.  Exam reveals no gallop and no friction rub.   No murmur heard. Pulmonary/Chest: Effort normal and breath sounds normal. No accessory muscle usage. No respiratory distress.  Abdominal: Soft. Bowel sounds are normal. He exhibits no distension. There is no tenderness.  Genitourinary:  No cva tenderness  Musculoskeletal: Normal range of motion. He exhibits no edema and no tenderness.  Neurological: He is alert and oriented to person, place, and time.  Skin: Skin is warm and dry. No rash noted. He is not diaphoretic.  No EM rash, or other rash/skin lesions  Psychiatric: He has a normal mood and affect.    ED Course  Procedures (including critical care time)  Results for orders placed during the hospital encounter of 05/08/13  CBC      Result Value Range   WBC 9.1  4.0 - 10.5 K/uL   RBC 5.33  4.22 - 5.81 MIL/uL   Hemoglobin 16.6  13.0 - 17.0 g/dL   HCT 16.1  09.6 - 04.5 %   MCV 86.7  78.0 - 100.0 fL   MCH 31.1  26.0 - 34.0 pg   MCHC 35.9  30.0 - 36.0 g/dL   RDW 40.9  81.1 - 91.4 %   Platelets 127 (*) 150 - 400 K/uL  COMPREHENSIVE METABOLIC PANEL      Result Value Range   Sodium 138  135 - 145 mEq/L   Potassium 4.0  3.5 - 5.1 mEq/L   Chloride 104  96 - 112 mEq/L   CO2 26  19 - 32 mEq/L   Glucose, Bld 113 (*) 70 - 99 mg/dL   BUN 15  6 - 23 mg/dL   Creatinine, Ser 7.82  0.50 - 1.35 mg/dL   Calcium 9.9  8.4 - 95.6 mg/dL   Total Protein 7.3  6.0 - 8.3 g/dL   Albumin 4.2  3.5 - 5.2 g/dL   AST 53 (*) 0 - 37 U/L   ALT 81 (*) 0 - 53 U/L   Alkaline Phosphatase 97  39 - 117 U/L   Total Bilirubin 1.7 (*) 0.3 - 1.2 mg/dL   GFR calc non Af  Amer >90  >90 mL/min   GFR calc Af Amer >90  >90 mL/min  TROPONIN I      Result Value Range  Troponin I <0.30  <0.30 ng/mL  URINALYSIS, ROUTINE W REFLEX MICROSCOPIC      Result Value Range   Color, Urine YELLOW  YELLOW   APPearance CLEAR  CLEAR   Specific Gravity, Urine 1.022  1.005 - 1.030   pH 5.5  5.0 - 8.0   Glucose, UA NEGATIVE  NEGATIVE mg/dL   Hgb urine dipstick NEGATIVE  NEGATIVE   Bilirubin Urine NEGATIVE  NEGATIVE   Ketones, ur NEGATIVE  NEGATIVE mg/dL   Protein, ur NEGATIVE  NEGATIVE mg/dL   Urobilinogen, UA 0.2  0.0 - 1.0 mg/dL   Nitrite NEGATIVE  NEGATIVE   Leukocytes, UA NEGATIVE  NEGATIVE   Dg Chest 2 View  05/08/2013   *RADIOLOGY REPORT*  Clinical Data: Chest pain.  CHEST - 2 VIEW  Comparison: 10/19/2011.  Findings: The cardiac silhouette, mediastinal and hilar contours are normal.  The lungs are clear.  No pleural effusion.  The bony thorax is intact.  IMPRESSION: Normal chest x-ray.   Original Report Authenticated By: Rudie Meyer, M.D.       MDM  Iv ns. Labs. Monitor. Cxr. Ecg.  Reviewed nursing notes and prior charts for additional history.    Date: 05/08/2013  Rate: 67  Rhythm: normal sinus rhythm  QRS Axis: normal  Intervals: normal  ST/T Wave abnormalities: normal  Conduction Disutrbances:none  Narrative Interpretation:   Old EKG Reviewed: unchanged  After symptoms present/constant since last pm, trop neg, and ecg normal.  Vitals normal.  On recheck alert, content, afeb, normal vitals.  Pt appears stable for d/c.  Pt reports normal stress test 2 yrs ago.  pcp is Dr Valentina Lucks, and pt/spouse indicate plan to f/u there tomorrow.   Discussed diff dx including viral illness (given recent mild diarrhea illness, body aches, etc) vs tick borne illness (given recent tick bite, body aches, ?subj fever) vs other.  Cp very atypical and has resolved completely w normal trop and ecg - discussed f/u pcp tomorrow and discuss possible referral back to card  then for possible stress testing.  Discussed lfts w pt, and pcp f/u for same. Pt has no abd pain or tenderness. No nv.         Suzi Roots, MD 05/08/13 1256

## 2013-05-08 NOTE — ED Notes (Signed)
That he took a tick off his abd last week

## 2013-05-08 NOTE — ED Notes (Signed)
EKG performed in triage

## 2013-05-08 NOTE — ED Notes (Signed)
Pt c/o left sided CP with lightheadedness and diaphoresis starting last night

## 2013-05-08 NOTE — ED Notes (Signed)
Feeling bad last night had some hor flashes and pain on left side of chest has had diarrhea and some dizziness, got up felt better and then started to have sweating and diarrhea

## 2013-06-13 ENCOUNTER — Emergency Department (HOSPITAL_COMMUNITY): Payer: BC Managed Care – PPO

## 2013-06-13 ENCOUNTER — Emergency Department (HOSPITAL_COMMUNITY)
Admission: EM | Admit: 2013-06-13 | Discharge: 2013-06-13 | Disposition: A | Discharge status: Home / Self Care | Payer: BC Managed Care – PPO | Attending: Emergency Medicine | Admitting: Emergency Medicine

## 2013-06-13 ENCOUNTER — Encounter (HOSPITAL_COMMUNITY): Payer: Self-pay | Admitting: Emergency Medicine

## 2013-06-13 DIAGNOSIS — R0789 Other chest pain: Secondary | ICD-10-CM | POA: Insufficient documentation

## 2013-06-13 DIAGNOSIS — Z882 Allergy status to sulfonamides status: Secondary | ICD-10-CM | POA: Insufficient documentation

## 2013-06-13 DIAGNOSIS — Z7982 Long term (current) use of aspirin: Secondary | ICD-10-CM | POA: Insufficient documentation

## 2013-06-13 DIAGNOSIS — K219 Gastro-esophageal reflux disease without esophagitis: Secondary | ICD-10-CM | POA: Insufficient documentation

## 2013-06-13 DIAGNOSIS — F40298 Other specified phobia: Secondary | ICD-10-CM | POA: Insufficient documentation

## 2013-06-13 DIAGNOSIS — G4730 Sleep apnea, unspecified: Secondary | ICD-10-CM | POA: Insufficient documentation

## 2013-06-13 DIAGNOSIS — Z888 Allergy status to other drugs, medicaments and biological substances status: Secondary | ICD-10-CM | POA: Insufficient documentation

## 2013-06-13 DIAGNOSIS — Z79899 Other long term (current) drug therapy: Secondary | ICD-10-CM | POA: Insufficient documentation

## 2013-06-13 DIAGNOSIS — I10 Essential (primary) hypertension: Secondary | ICD-10-CM | POA: Insufficient documentation

## 2013-06-13 DIAGNOSIS — Z8719 Personal history of other diseases of the digestive system: Secondary | ICD-10-CM | POA: Insufficient documentation

## 2013-06-13 DIAGNOSIS — Z87442 Personal history of urinary calculi: Secondary | ICD-10-CM | POA: Insufficient documentation

## 2013-06-13 DIAGNOSIS — Z880 Allergy status to penicillin: Secondary | ICD-10-CM | POA: Insufficient documentation

## 2013-06-13 DIAGNOSIS — R42 Dizziness and giddiness: Secondary | ICD-10-CM | POA: Insufficient documentation

## 2013-06-13 LAB — CBC
HCT: 47.7 % (ref 39.0–52.0)
Hemoglobin: 16.8 g/dL (ref 13.0–17.0)
MCH: 30.9 pg (ref 26.0–34.0)
MCHC: 35.2 g/dL (ref 30.0–36.0)
RBC: 5.44 MIL/uL (ref 4.22–5.81)

## 2013-06-13 LAB — POCT I-STAT TROPONIN I: Troponin i, poc: 0 ng/mL (ref 0.00–0.08)

## 2013-06-13 LAB — BASIC METABOLIC PANEL
BUN: 15 mg/dL (ref 6–23)
CO2: 28 mEq/L (ref 19–32)
Calcium: 9.4 mg/dL (ref 8.4–10.5)
GFR calc non Af Amer: >90 mL/min (ref >90)
Glucose, Bld: 112 mg/dL — ABNORMAL HIGH (ref 70–99)

## 2013-06-13 MED ORDER — MECLIZINE HCL 25 MG PO TABS
25.0000 mg | ORAL_TABLET | Freq: Once | ORAL | Status: AC
  Administered 2013-06-13: 25 mg via ORAL
  Filled 2013-06-13: qty 1

## 2013-06-13 NOTE — ED Provider Notes (Signed)
History    CSN: [id number] Arrival date & time 06/13/13  0306  First MD Initiated Contact with Patient 06/13/13 [phone]     Chief Complaint  Patient presents with  . Chest Pain   (Consider location/radiation/quality/duration/timing/severity/associated sxs/prior Treatment) HPI Comments: Patient presents with complaints of dizziness off and on for the past month.  He was seen here and was told was possibly vertigo.  Was seen by Dr. Jenne Pane and was told everything was okay.  He was set up for an mri which he could tolerate due to claustrophobia, so this was not done.  He woke this AM feeling dizzy, so decided to come in to be checked.    Patient is a 57 y.o. male presenting with chest pain. The history is provided by the patient.  Chest Pain Pain location:  Substernal area Pain quality: tightness   Pain radiates to:  Does not radiate Pain radiates to the back: no   Pain severity:  Mild Onset quality:  Sudden Duration:  3 hours Timing:  Constant Progression:  Unchanged Chronicity:  New Relieved by:  Nothing Worsened by:  Nothing tried  Past Medical History  Diagnosis Date  . Hypertension   . GERD (gastroesophageal reflux disease)   . Hiatal hernia   . Chronic kidney disease     kidney stones  . Sleep apnea     Asked to bring mask and tubing for procedure   Past Surgical History  Procedure Laterality Date  . Kid stones    . Shoulder arthroscopy  10/18/2011    Procedure: ARTHROSCOPY SHOULDER;  Surgeon: Velna Ochs, MD;  Location: [facility];  Service: Orthopedics;  Laterality: Right;  right shoulder arthroscopy , acromioplasty, partial DCR and rotator cuff repair  . Lithotripsy    . Eus N/A 01/11/2013    Procedure: ESOPHAGEAL ENDOSCOPIC ULTRASOUND (EUS) RADIAL;  Surgeon: Willis Modena, MD;  Location: [facility];  Service: Endoscopy;  Laterality: N/A;  radial scope  will bring h&p   No family history on file. History  Substance Use Topics  . Smoking  status: Never Smoker   . Smokeless tobacco: Never Used  . Alcohol Use: Yes     Comment: Rarely    Review of Systems  Cardiovascular: Positive for chest pain.  All other systems reviewed and are negative.    Allergies  Lisinopril; Sulfa antibiotics; and Penicillins  Home Medications   Current Outpatient Rx  Name  Route  Sig  Dispense  Refill  . aspirin 81 MG tablet   Oral   Take 81 mg by mouth daily.           . cholecalciferol (VITAMIN D) 1000 UNITS tablet   Oral   Take 1,000 Units by mouth daily.           . clobetasol (TEMOVATE) 0.05 % external solution   Topical   Apply 1 application topically daily as needed.         . doxycycline (VIBRAMYCIN) 100 MG capsule   Oral   Take 1 capsule (100 mg total) by mouth 2 (two) times daily.   20 capsule   0   . fluticasone (CUTIVATE) 0.05 % cream   Topical   Apply topically daily.         . meloxicam (MOBIC) 15 MG tablet   Oral   Take 15 mg by mouth daily.         Marland Kitchen omeprazole (PRILOSEC OTC) 20 MG tablet   Oral  Take 20 mg by mouth daily.          . valsartan (DIOVAN) 160 MG tablet   Oral   Take 160 mg by mouth daily before breakfast.          BP 147/94  Pulse 73  Resp 15  SpO2 99% Physical Exam  Nursing note and vitals reviewed. Constitutional: He is oriented to person, place, and time. He appears well-developed and well-nourished. No distress.  HENT:  Head: Normocephalic and atraumatic.  Mouth/Throat: Oropharynx is clear and moist.  Eyes: EOM are normal. Pupils are equal, round, and reactive to light.  Neck: Normal range of motion. Neck supple.  Cardiovascular: Normal rate, regular rhythm and normal heart sounds.   No murmur heard. Pulmonary/Chest: Effort normal and breath sounds normal. No respiratory distress. He has no wheezes.  Abdominal: Soft. Bowel sounds are normal. He exhibits no distension. There is no tenderness.  Musculoskeletal: Normal range of motion. He exhibits no edema.   Lymphadenopathy:    He has no cervical adenopathy.  Neurological: He is alert and oriented to person, place, and time. No cranial nerve deficit. He exhibits normal muscle tone. Coordination normal.  Skin: Skin is warm and dry. He is not diaphoretic.    ED Course  Procedures (including critical care time) Labs Reviewed  CBC - Abnormal; Notable for the following:    Platelets 123 (*)    All other components within normal limits  BASIC METABOLIC PANEL  PRO B NATRIURETIC PEPTIDE  TROPONIN I  POCT I-STAT TROPONIN I   No results found. No diagnosis found.   Date: 06/13/2013  Rate: 66  Rhythm: normal sinus rhythm  QRS Axis: normal  Intervals: normal  ST/T Wave abnormalities: normal  Conduction Disutrbances:none  Narrative Interpretation:   Old EKG Reviewed: none available    MDM  The patient presents here with complaints that sound vertiginous in nature.  The ct tonight is unremarkable and the cardiac workup is negative.  He had attempted an mri a few days ago but could not get this done due to claustrophobia.  I will order an mri as an outpatient and recommend sedation for him to complete the exam.  I have spoken with radiology who has sent me the requisition forms which I have filled out and returned to them.  They will call the patient to arrange the study.   Geoffery Lyons, MD 06/13/13 [phone]

## 2013-06-13 NOTE — ED Notes (Signed)
PT. REPORTS PAIN ACROSS HIS CHEST LAST NIGHT WITH SLIGHT SOB , DIZZINESS AND OCCASIONAL DRY COUGH , ALSO REPORTS ELEVATED BP= 165/102 LAST NIGHT . DENIES CHEST PAIN AT ARRIVAL .

## 2013-06-13 NOTE — ED Notes (Signed)
Dr. Delo at bedside. 

## 2013-06-13 NOTE — ED Notes (Addendum)
Pt states he was here a few weeks ago with dizziness and CP. Was supposed to have MRI done yesterday to determine cause of dizziness, went to [facility] yesterday but was unable to have test done due to claustrophobia. Pt states he woke up this morning at 2am with CP and dizziness. No SOB.

## 2013-06-25 ENCOUNTER — Telehealth (HOSPITAL_COMMUNITY): Payer: Self-pay | Admitting: *Deleted

## 2013-06-25 NOTE — Telephone Encounter (Signed)
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 YES   Ever got metal in your eyes? YES  If yes, was it removed by a doctor? YES, verified via xray  Brain Surgery NO  Inner Ear Surgery NO  Renal/Liver Dz NO, has had elevated liver function labs in past  Heart Surgery  NO  (if yes, what type and when N/A )  Pacemaker NO  High BP YES, is taking RX to treat.  Still remains in 140's/ high 80's to low 90's  Eye Implants NO  Pregnancy NO - N/A  Diabetic  NO  Stent NO  (If yes, date of placement N/A )  Hx of cancer NO (If yes, what kind? N/A )   1.  Report to radiology on first floor on Wed June 26, 2013 At 1200 noon am/pm  2.  Do Not eat food after 6 AM      Do Not drink clear liquids after 8 AM  3.  If discharged the same day as procedure, you will need a responsible adult to drive          You home.  Who will drive you home Wife 4.  If discharged the day of your procedure, a responsible adult should be with the patient      For 8 hours - Wife  5.  If plans include a taxi or bus for transportation home after procedure, a friend or family      member must accompany you N/A  6.  If taking routine medications, please list the names and dosages of all your medications, or bring these medications to the hospital for identification:  Diovan, ASA, Vit D, Cream for Psoriasis   7.  Take usual medications the morning of your procedure, except - may take all regular meds  8.  Do you have a history of heart problems?  No  9.  Do you have a cardiologist? N/A  10.  Do you have any metal or implants inside your body? No  11.  Do you have any allergies to food or medications? No  12.  Do you have any allergies to latex or contrast dye? No  13.  To whom were these instructions given? Jose [name]

## 2013-06-26 ENCOUNTER — Other Ambulatory Visit (HOSPITAL_COMMUNITY): Payer: Self-pay | Admitting: *Deleted

## 2013-06-26 ENCOUNTER — Ambulatory Visit (HOSPITAL_COMMUNITY)
Admission: RE | Admit: 2013-06-26 | Discharge: 2013-06-26 | Disposition: A | Discharge status: Home / Self Care | Payer: BC Managed Care – PPO | Source: Ambulatory Visit | Attending: Emergency Medicine | Admitting: Emergency Medicine

## 2013-06-26 DIAGNOSIS — Z8673 Personal history of transient ischemic attack (TIA), and cerebral infarction without residual deficits: Secondary | ICD-10-CM | POA: Insufficient documentation

## 2013-06-26 DIAGNOSIS — N189 Chronic kidney disease, unspecified: Secondary | ICD-10-CM | POA: Insufficient documentation

## 2013-06-26 DIAGNOSIS — I129 Hypertensive chronic kidney disease with stage 1 through stage 4 chronic kidney disease, or unspecified chronic kidney disease: Secondary | ICD-10-CM | POA: Insufficient documentation

## 2013-06-26 DIAGNOSIS — K219 Gastro-esophageal reflux disease without esophagitis: Secondary | ICD-10-CM | POA: Insufficient documentation

## 2013-06-26 DIAGNOSIS — K449 Diaphragmatic hernia without obstruction or gangrene: Secondary | ICD-10-CM | POA: Insufficient documentation

## 2013-06-26 DIAGNOSIS — Z87442 Personal history of urinary calculi: Secondary | ICD-10-CM | POA: Insufficient documentation

## 2013-06-26 DIAGNOSIS — R42 Dizziness and giddiness: Secondary | ICD-10-CM | POA: Insufficient documentation

## 2013-06-26 DIAGNOSIS — G4730 Sleep apnea, unspecified: Secondary | ICD-10-CM | POA: Insufficient documentation

## 2013-06-26 DIAGNOSIS — H9319 Tinnitus, unspecified ear: Secondary | ICD-10-CM | POA: Insufficient documentation

## 2013-06-26 MED ORDER — MIDAZOLAM HCL 2 MG/2ML IJ SOLN
INTRAMUSCULAR | Status: AC
  Filled 2013-06-26: qty 10

## 2013-06-26 MED ORDER — FENTANYL CITRATE 0.05 MG/ML IJ SOLN
25.0000 ug | INTRAMUSCULAR | Status: DC | PRN
  Administered 2013-06-26 (×3): 50 ug via INTRAVENOUS

## 2013-06-26 MED ORDER — MIDAZOLAM HCL 2 MG/2ML IJ SOLN
1.0000 mg | INTRAMUSCULAR | Status: DC | PRN
  Administered 2013-06-26 (×3): 2 mg via INTRAVENOUS

## 2013-06-26 MED ORDER — FENTANYL CITRATE 0.05 MG/ML IJ SOLN
INTRAMUSCULAR | Status: AC
  Filled 2013-06-26: qty 4

## 2013-06-26 NOTE — Progress Notes (Signed)
Patient ID: Jose [name], male   DOB: 1956-05-16, 57 y.o.   MRN: [id number] Request received for MRI brain with IV conscious sedation on pt with hx intermittent dizziness, left ear tinnitus, vertigo, balance difficulties for past month. Additional PMH as below. Exam: pt awake/alert; chest- CTA bilat; heart- RRR; abd- soft,+BS,NT; ext- FROM, no edema.  Pt denies recent fever, CP , cough ,HA, dyspnea, abd/back pain, N/V or unusual bleeding. Filed Vitals:   06/26/13 1205 06/26/13 1311  BP: 164/92 164/92  Pulse: 70 70  Resp: 14 20  SpO2:  98%   Past Medical History  Diagnosis Date  . Hypertension   . GERD (gastroesophageal reflux disease)   . Hiatal hernia   . Chronic kidney disease     kidney stones  . Sleep apnea     Asked to bring mask and tubing for procedure   Past Surgical History  Procedure Laterality Date  . Kid stones    . Shoulder arthroscopy  10/18/2011    Procedure: ARTHROSCOPY SHOULDER;  [name]: Velna Ochs, MD;  Location: [facility];  Service: Orthopedics;  Laterality: Right;  right shoulder arthroscopy , acromioplasty, partial DCR and rotator cuff repair  . Lithotripsy    . Eus N/A 01/11/2013    Procedure: ESOPHAGEAL ENDOSCOPIC ULTRASOUND (EUS) RADIAL;  [name]: Willis Modena, MD;  Location: [facility];  Service: Endoscopy;  Laterality: N/A;  radial scope  will bring h&p  Dg Chest 2 View  06/13/2013   *RADIOLOGY REPORT*  Clinical Data: Left-sided chest pain.  Dizziness.  CHEST - 2 VIEW  Comparison: 05/08/2013  Findings: The heart size and pulmonary vascularity are normal. The lungs appear clear and expanded without focal air space disease or consolidation. No blunting of the costophrenic angles.  No pneumothorax.  Mediastinal contours appear intact.  Degenerative changes in the spine.  No significant change since previous study.  IMPRESSION: No evidence of active pulmonary disease.   Original Report Authenticated By: Burman Nieves, M.D.   Ct Head Wo  Contrast  06/13/2013   *RADIOLOGY REPORT*  Clinical Data: Dizziness.  Similar episode 3 months ago.  CT HEAD WITHOUT CONTRAST  Technique:  Contiguous axial images were obtained from the base of the skull through the vertex without contrast.  Comparison: None.  Findings: The ventricles and sulci are symmetrical without significant effacement, displacement, or dilatation. No mass effect or midline shift. No abnormal extra-axial fluid collections. The grey-white matter junction is distinct. Basal cisterns are not effaced. No acute intracranial hemorrhage. No depressed skull fractures.  Opacification of the left mastoid air cells.  Right mastoid air cells and visualized paranasal sinuses are not opacified.  IMPRESSION: Opacification of left mastoid air cells.  No acute intracranial abnormalities.   Original Report Authenticated By: Burman Nieves, M.D.  Results for orders placed during the hospital encounter of 06/13/13  CBC      Result Value Range   WBC 9.9  4.0 - 10.5 K/uL   RBC 5.44  4.22 - 5.81 MIL/uL   Hemoglobin 16.8  13.0 - 17.0 g/dL   HCT 40.9  81.1 - 91.4 %   MCV 87.7  78.0 - 100.0 fL   MCH 30.9  26.0 - 34.0 pg   MCHC 35.2  30.0 - 36.0 g/dL   RDW 78.2  95.6 - 21.3 %   Platelets 123 (*) 150 - 400 K/uL  BASIC METABOLIC PANEL      Result Value Range   Sodium 138  135 - 145 mEq/L  Potassium 4.0  3.5 - 5.1 mEq/L   Chloride 102  96 - 112 mEq/L   CO2 28  19 - 32 mEq/L   Glucose, Bld 112 (*) 70 - 99 mg/dL   BUN 15  6 - 23 mg/dL   Creatinine, Ser 9.62  0.50 - 1.35 mg/dL   Calcium 9.4  8.4 - 95.2 mg/dL   GFR calc non Af Amer >90  >90 mL/min   GFR calc Af Amer >90  >90 mL/min  PRO B NATRIURETIC PEPTIDE      Result Value Range   Pro B Natriuretic peptide (BNP) 8.7  0 - 125 pg/mL  TROPONIN I      Result Value Range   Troponin I <0.30  <0.30 ng/mL  POCT I-STAT TROPONIN I      Result Value Range   Troponin i, poc 0.00  0.00 - 0.08 ng/mL   Comment 3            A/P: Pt with hx intermittent  dizziness, vertigo, left ear tinnitus and balance difficulties of 1 months duration. Plan is for MRI brain with IV conscious sedation today for further assessment. Details/risks of procedure d/w pt/wife with their understanding and consent.

## 2013-06-26 NOTE — ED Notes (Signed)
Pt taken into MRI room and O2 sensor would register.  Tried moving O2 probe to multiple fingers and sensor still did not register. O2 sensor brought from other MRI room and still could not pick up a signal.

## 2013-06-26 NOTE — Progress Notes (Signed)
Discharge instructions given to patient and wife. No questions/concerns.

## 2013-06-26 NOTE — ED Notes (Signed)
O2 sensor working.

## 2013-10-24 IMAGING — CR DG ORBITS FOR FOREIGN BODY
2 series · 2 of 2 positions shown · non-contrast
Comparison: None.

CLINICAL DATA: History of metal exposure, clearance for MRI

ORBITS FOR FOREIGN BODY - 2 VIEW

[view not recorded (1 of 2)]
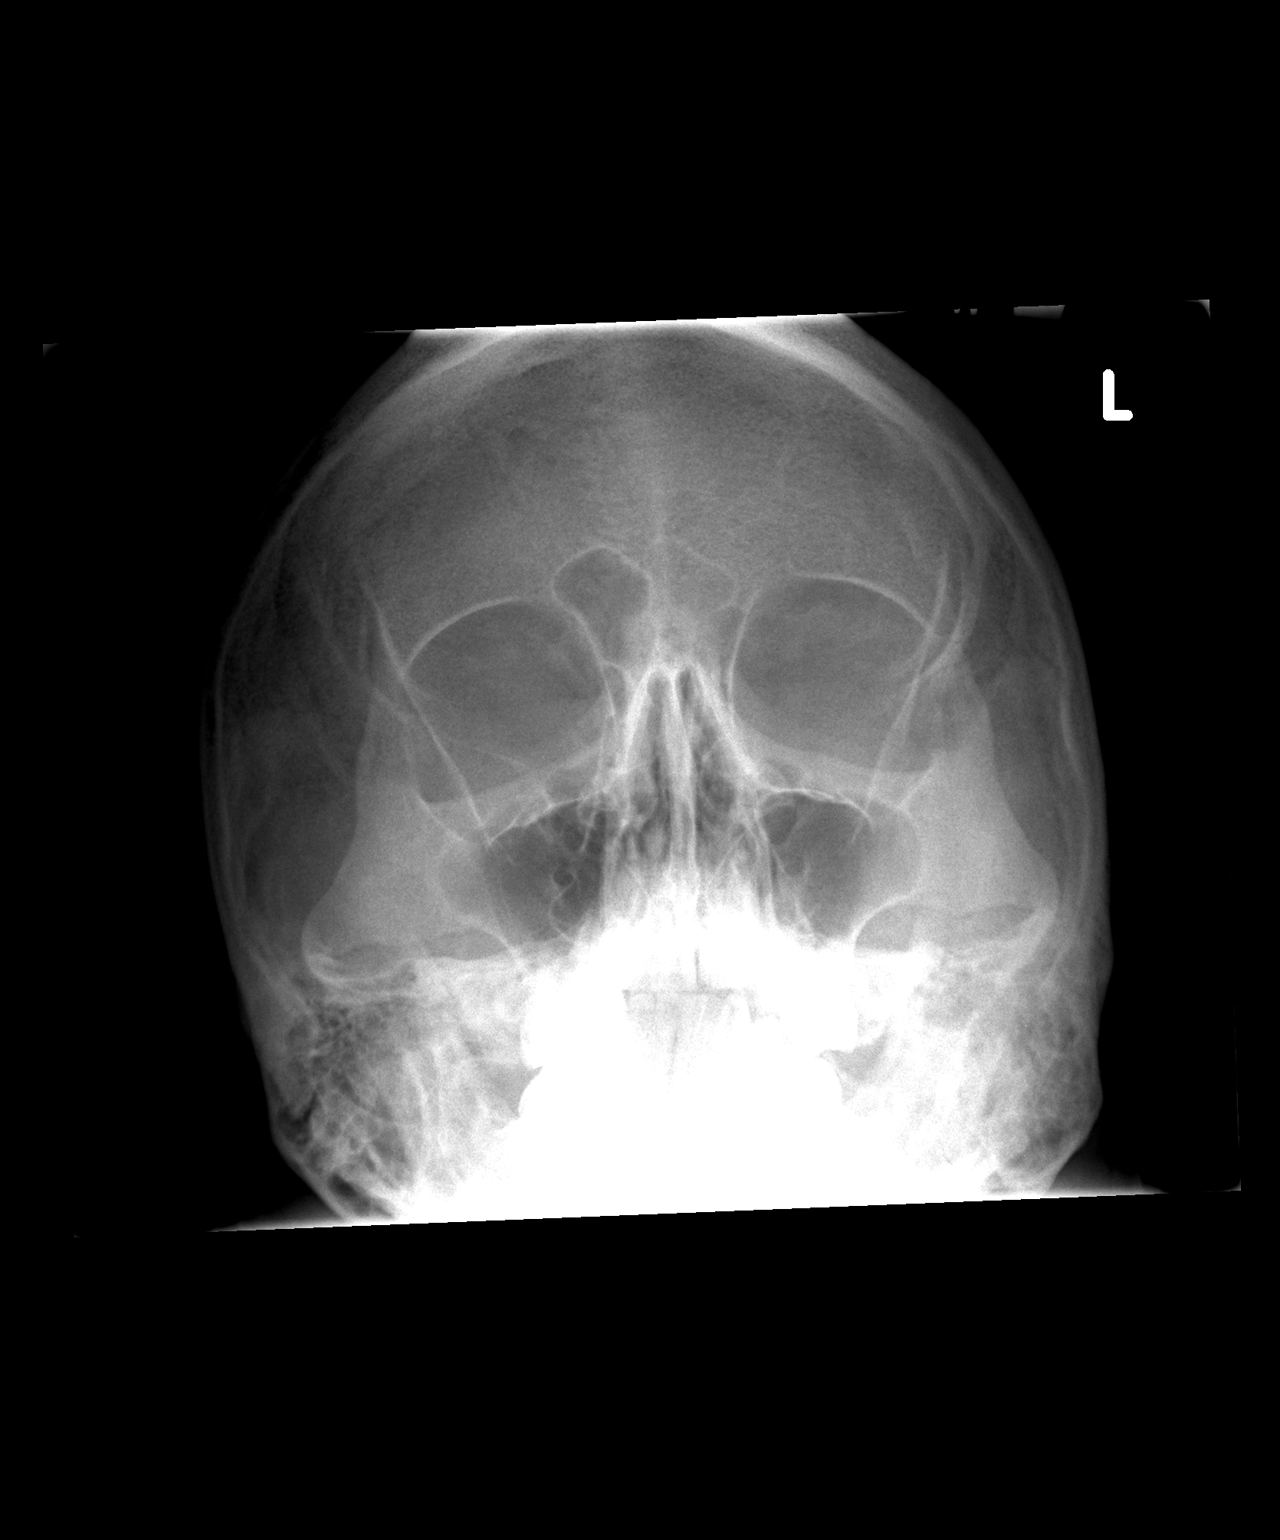

[view not recorded (2 of 2)]
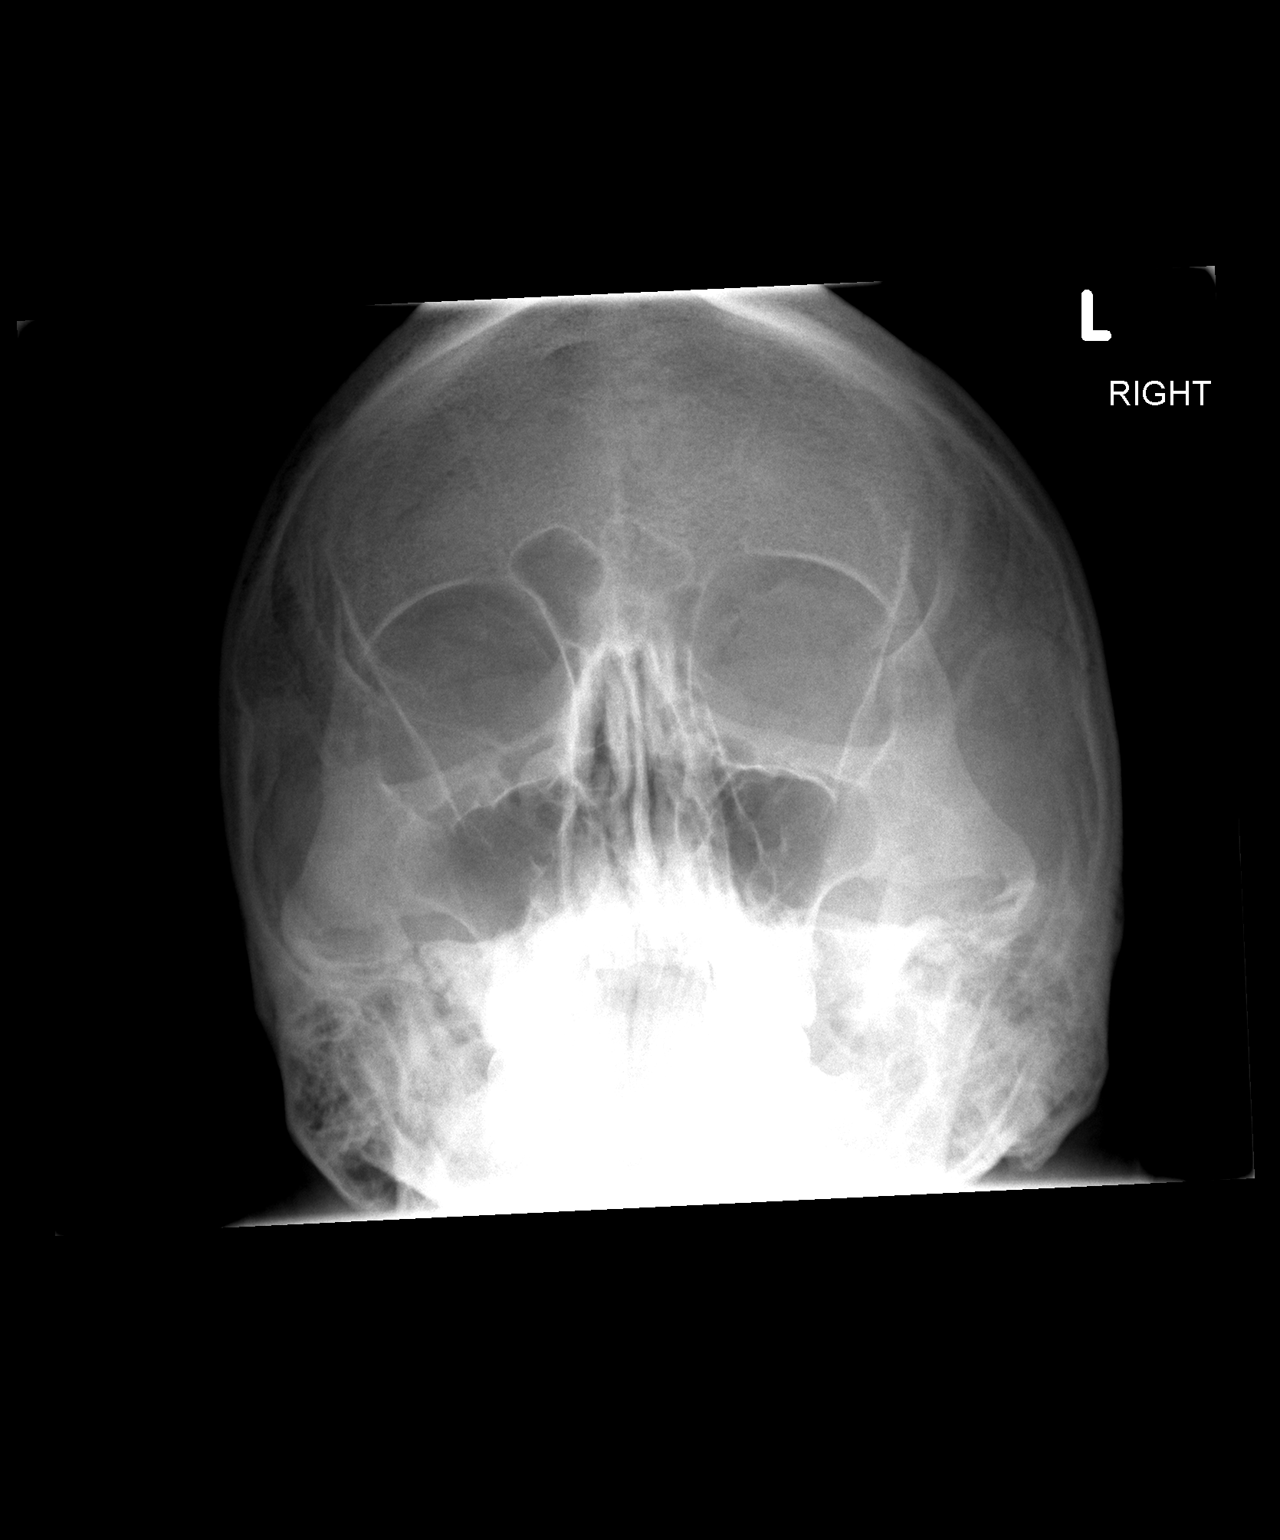

[2 of 2 positions shown; findings below may reference images not displayed]

FINDINGS: Views of the orbits were obtained with the patient
looking to the left and looking to the right.  No orbital metallic
foreign body is seen.  The paranasal sinuses appear clear.  No bony
abnormality is seen.
IMPRESSION: No orbital metallic foreign body.

## 2013-11-03 ENCOUNTER — Encounter: Payer: Self-pay | Admitting: Cardiology

## 2013-11-04 ENCOUNTER — Encounter: Payer: Self-pay | Admitting: Cardiology

## 2013-11-04 ENCOUNTER — Ambulatory Visit (INDEPENDENT_AMBULATORY_CARE_PROVIDER_SITE_OTHER): Payer: BC Managed Care – PPO | Admitting: Cardiology

## 2013-11-04 VITALS — BP 128/86 | HR 71 | Ht 64.0 in | Wt 206.0 lb

## 2013-11-04 DIAGNOSIS — E669 Obesity, unspecified: Secondary | ICD-10-CM

## 2013-11-04 DIAGNOSIS — Z0000 Encounter for general adult medical examination without abnormal findings: Secondary | ICD-10-CM

## 2013-11-04 DIAGNOSIS — R002 Palpitations: Secondary | ICD-10-CM

## 2013-11-04 DIAGNOSIS — R9431 Abnormal electrocardiogram [ECG] [EKG]: Secondary | ICD-10-CM

## 2013-11-04 DIAGNOSIS — R42 Dizziness and giddiness: Secondary | ICD-10-CM

## 2013-11-04 DIAGNOSIS — E119 Type 2 diabetes mellitus without complications: Secondary | ICD-10-CM

## 2013-11-04 HISTORY — DX: Palpitations: R00.2

## 2013-11-04 HISTORY — DX: Dizziness and giddiness: R42

## 2013-11-04 HISTORY — DX: Abnormal electrocardiogram (ECG) (EKG): R94.31

## 2013-11-04 NOTE — Progress Notes (Signed)
[address] [place], [place]  [address] Phone: [phone] Fax:  [phone]  Date:  11/04/2013   ID:  Jose [name], DOB Jan 02, 1956, MRN [id number]  PCP:  Lillia Mountain, MD   History of Present Illness: Jose [name] is a 57 y.o. 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   In July of 2014, coming home from work felt confused while driving. Got home, rested. BP was taken and settled down. Went to work the next day and felt dizzy, went to ER. Sent home. ENT evaluation normal. Suggested MRI. Fluid behind mastoids noted. Happened again. Dr. Valentina Lucks changed BP medication. Occasional listing when walking, ringing in left ear. Also feels a warm sensation down calves. Denies any pain when walking. He has had low back pain recently.  Last Tuesday felt this dizzy feeling and mid chest wall felt palpitation. Felt like it was not beating right. Currently feeling normal.  A1c was 6.7.   Wt Readings from Last 3 Encounters:  11/04/13 206 lb (93.441 kg)  01/11/13 215 lb (97.523 kg)  01/11/13 215 lb (97.523 kg)     Past Medical History  Diagnosis Date  . Hypertension   . GERD (gastroesophageal reflux disease)   . Hiatal hernia   . Chronic kidney disease     kidney stones  . Sleep apnea     Asked to bring mask and tubing for procedure    Past Surgical History  Procedure Laterality Date  . Kid stones    . Shoulder arthroscopy  10/18/2011    Procedure: ARTHROSCOPY SHOULDER;  Surgeon: Velna Ochs, MD;  Location: [facility];  Service: Orthopedics;  Laterality: Right;  right shoulder arthroscopy , acromioplasty, partial DCR and rotator cuff repair  . Lithotripsy    . Eus N/A 01/11/2013    Procedure: ESOPHAGEAL ENDOSCOPIC ULTRASOUND (EUS) RADIAL;  Surgeon: Willis Modena, MD;  Location: [facility];  Service: Endoscopy;  Laterality: N/A;  radial scope  will bring h&p    Current Outpatient Prescriptions  Medication Sig Dispense Refill  . amLODipine-valsartan (EXFORGE) 5-160 MG per tablet Take 1 tablet by mouth daily.      Marland Kitchen aspirin 81 MG tablet Take 81 mg by mouth daily.        . Cholecalciferol (VITAMIN D-3) 1000 UNITS CAPS Take by mouth daily.      . clobetasol (TEMOVATE) 0.05 % external solution Apply 1 application topically daily as needed.      . fluticasone (CUTIVATE) 0.05 % cream Apply topically as needed.       Marland Kitchen omeprazole (PRILOSEC OTC) 20 MG tablet Take 20 mg by mouth as needed.        No current facility-administered medications for this visit.    Allergies:    Allergies  Allergen Reactions  . Lisinopril     dehydration  . Sulfa Antibiotics     nausea  . Penicillins Rash    Social History:  The patient  reports that he has never smoked. He has never used smokeless tobacco. He reports that he drinks alcohol. He reports that he does not use illicit drugs.   Family History  Problem Relation Age of Onset  . Other Mother     Brain Tumor  . Heart attack Father   . Diabetes Father   . Gastric  cancer Father   . Coronary artery disease Father   . Thyroid nodules Sister   . CVA Brother   . Diabetes Brother   . Thyroid nodules Sister     ROS:  Please see the history of present illness.   Denies any syncope, chest pain, shortness of breath, fevers, chills, rash, orthopnea. Positive for psoriasis like plaquing, back pain.   All other systems reviewed and negative.   PHYSICAL EXAM: VS:  BP 128/86  Pulse 71  Ht 5\' 4"  (1.626 m)  Wt 206 lb (93.441 kg)  BMI 35.34 kg/m2 Well nourished, well developed, in no acute distress HEENT: normal, [place]/AT, EOMI Neck: no JVD, normal carotid upstroke, no bruit Cardiac:  normal S1, S2; RRR; no murmur Lungs:  clear to auscultation bilaterally, no wheezing, rhonchi or rales Abd: soft, nontender,  no hepatomegaly, no bruitsOverweight Ext: no edema, 2+ distal pulses Skin: warm and dryPsoriasis like plaquing elbow bilaterally GU: deferred Neuro: no focal abnormalities noted, AAO x 3  EKG:  Sinus rhythm rate 71, small Q wave in aVF. Q wave in 3. Poor R wave progression.  ASSESSMENT AND PLAN:  1. Abnormal EKG-possible old inferior infarct pattern. Q waves present hopefully nonpathologic. Also poor R wave progression noted. Likely secondary to body habitus. He denies any chest pain. Denies any active anginal symptoms. We will check an echocardiogram to ensure proper structure and function. 2. Dizziness/disequilibrium-with ringing in his ears left greater than right, occasional listing while walking it does sound more like equilibrium issue. 3. Obesity-encouraged weight loss. 4. Palpitations-no further recurrence. No high-risk symptoms such as syncope, ongoing racing. Avoid excessive caffeine. Treat sleep apnea. EKG shows no evidence of PAC/PVC. 5. Warmth in calves bilaterally when laying down. Pulses appeared normal, bilateral DP. Possible early stages of neuropathy. Could also be related to low back pain. Continue to exercise. 6. Diabetes-he is working closely with Dr. Valentina Lucks. Dr. Valentina Lucks will see him back in March. Avitene weight loss. If diabetes remains, I would advocate statin therapy. 7. I will followup with echocardiogram. Otherwise when necessary visit.  Signed, Donato Schultz, MD [facility]  11/04/2013 10:47 AM

## 2013-11-04 NOTE — Patient Instructions (Signed)

## 2013-11-22 ENCOUNTER — Ambulatory Visit (HOSPITAL_COMMUNITY): Payer: BC Managed Care – PPO | Attending: Cardiology | Admitting: Radiology

## 2013-11-22 ENCOUNTER — Encounter: Payer: Self-pay | Admitting: Cardiology

## 2013-11-22 DIAGNOSIS — R42 Dizziness and giddiness: Secondary | ICD-10-CM

## 2013-11-22 DIAGNOSIS — E669 Obesity, unspecified: Secondary | ICD-10-CM | POA: Insufficient documentation

## 2013-11-22 DIAGNOSIS — R002 Palpitations: Secondary | ICD-10-CM

## 2013-11-22 DIAGNOSIS — Z6835 Body mass index (BMI) 35.0-35.9, adult: Secondary | ICD-10-CM | POA: Insufficient documentation

## 2013-11-22 DIAGNOSIS — E119 Type 2 diabetes mellitus without complications: Secondary | ICD-10-CM | POA: Insufficient documentation

## 2013-11-22 DIAGNOSIS — Z8673 Personal history of transient ischemic attack (TIA), and cerebral infarction without residual deficits: Secondary | ICD-10-CM | POA: Insufficient documentation

## 2013-11-22 DIAGNOSIS — R9431 Abnormal electrocardiogram [ECG] [EKG]: Secondary | ICD-10-CM

## 2013-11-22 NOTE — Progress Notes (Signed)
Echocardiogram performed.  

## 2014-10-22 ENCOUNTER — Ambulatory Visit
Admission: RE | Admit: 2014-10-22 | Discharge: 2014-10-22 | Disposition: A | Discharge status: Home / Self Care | Payer: BC Managed Care – PPO | Source: Ambulatory Visit | Attending: Internal Medicine | Admitting: Internal Medicine

## 2014-10-22 ENCOUNTER — Other Ambulatory Visit: Payer: Self-pay | Admitting: Internal Medicine

## 2014-10-22 DIAGNOSIS — M79641 Pain in right hand: Secondary | ICD-10-CM

## 2015-01-05 ENCOUNTER — Ambulatory Visit
Admission: RE | Admit: 2015-01-05 | Discharge: 2015-01-05 | Disposition: A | Discharge status: Home / Self Care | Payer: BC Managed Care – PPO | Source: Ambulatory Visit | Attending: Internal Medicine | Admitting: Internal Medicine

## 2015-01-05 ENCOUNTER — Other Ambulatory Visit: Payer: Self-pay | Admitting: Internal Medicine

## 2015-01-05 DIAGNOSIS — R1032 Left lower quadrant pain: Secondary | ICD-10-CM

## 2015-03-15 ENCOUNTER — Emergency Department (HOSPITAL_COMMUNITY)
Admission: EM | Admit: 2015-03-15 | Discharge: 2015-03-16 | Disposition: A | Discharge status: Home / Self Care | Payer: BC Managed Care – PPO | Attending: Emergency Medicine | Admitting: Emergency Medicine

## 2015-03-15 ENCOUNTER — Emergency Department (HOSPITAL_COMMUNITY): Payer: BC Managed Care – PPO

## 2015-03-15 ENCOUNTER — Encounter (HOSPITAL_COMMUNITY): Payer: Self-pay | Admitting: *Deleted

## 2015-03-15 DIAGNOSIS — Z7982 Long term (current) use of aspirin: Secondary | ICD-10-CM | POA: Insufficient documentation

## 2015-03-15 DIAGNOSIS — M542 Cervicalgia: Secondary | ICD-10-CM | POA: Diagnosis not present

## 2015-03-15 DIAGNOSIS — R0789 Other chest pain: Secondary | ICD-10-CM | POA: Diagnosis not present

## 2015-03-15 DIAGNOSIS — Z880 Allergy status to penicillin: Secondary | ICD-10-CM | POA: Diagnosis not present

## 2015-03-15 DIAGNOSIS — R531 Weakness: Secondary | ICD-10-CM | POA: Diagnosis not present

## 2015-03-15 DIAGNOSIS — N189 Chronic kidney disease, unspecified: Secondary | ICD-10-CM | POA: Diagnosis not present

## 2015-03-15 DIAGNOSIS — Z79899 Other long term (current) drug therapy: Secondary | ICD-10-CM | POA: Diagnosis not present

## 2015-03-15 DIAGNOSIS — R5383 Other fatigue: Secondary | ICD-10-CM | POA: Insufficient documentation

## 2015-03-15 DIAGNOSIS — Z8669 Personal history of other diseases of the nervous system and sense organs: Secondary | ICD-10-CM | POA: Diagnosis not present

## 2015-03-15 DIAGNOSIS — Z8719 Personal history of other diseases of the digestive system: Secondary | ICD-10-CM | POA: Diagnosis not present

## 2015-03-15 DIAGNOSIS — I129 Hypertensive chronic kidney disease with stage 1 through stage 4 chronic kidney disease, or unspecified chronic kidney disease: Secondary | ICD-10-CM | POA: Insufficient documentation

## 2015-03-15 LAB — BASIC METABOLIC PANEL
ANION GAP: 3 — AB (ref 5–15)
BUN: 20 mg/dL (ref 6–23)
CHLORIDE: 103 mmol/L (ref 96–112)
CO2: 31 mmol/L (ref 19–32)
Calcium: 9.3 mg/dL (ref 8.4–10.5)
Creatinine, Ser: 0.91 mg/dL (ref 0.50–1.35)
GLUCOSE: 100 mg/dL — AB (ref 70–99)
Potassium: 4.3 mmol/L (ref 3.5–5.1)
SODIUM: 137 mmol/L (ref 135–145)

## 2015-03-15 LAB — CBC WITH DIFFERENTIAL/PLATELET
BASOS PCT: 0 % (ref 0–1)
Basophils Absolute: 0 10*3/uL (ref 0.0–0.1)
Eosinophils Absolute: 0.2 10*3/uL (ref 0.0–0.7)
Eosinophils Relative: 2 % (ref 0–5)
HEMATOCRIT: 47 % (ref 39.0–52.0)
HEMOGLOBIN: 16.5 g/dL (ref 13.0–17.0)
LYMPHS ABS: 1.9 10*3/uL (ref 0.7–4.0)
LYMPHS PCT: 21 % (ref 12–46)
MCH: 31.3 pg (ref 26.0–34.0)
MCHC: 35.1 g/dL (ref 30.0–36.0)
MCV: 89 fL (ref 78.0–100.0)
MONOS PCT: 7 % (ref 3–12)
Monocytes Absolute: 0.6 10*3/uL (ref 0.1–1.0)
NEUTROS PCT: 70 % (ref 43–77)
Neutro Abs: 6.5 10*3/uL (ref 1.7–7.7)
PLATELETS: 137 10*3/uL — AB (ref 150–400)
RBC: 5.28 MIL/uL (ref 4.22–5.81)
RDW: 12.9 % (ref 11.5–15.5)
WBC: 9.1 10*3/uL (ref 4.0–10.5)

## 2015-03-15 LAB — TROPONIN I: Troponin I: <0.03 ng/mL (ref <0.031)

## 2015-03-15 MED ORDER — IOHEXOL 350 MG/ML SOLN
100.0000 mL | Freq: Once | INTRAVENOUS | Status: AC | PRN
  Administered 2015-03-15: 100 mL via INTRAVENOUS

## 2015-03-15 NOTE — ED Notes (Signed)
Patient transported to X-ray 

## 2015-03-15 NOTE — Discharge Instructions (Signed)
Your CT showed right thyroid nodule, chronic mastoid effusion, old stroke. It is otherwise normal. Please follow up with Dr. Laurann Montana tomorrow as scheduled. Return if worsening symptoms.

## 2015-03-15 NOTE — ED Notes (Signed)
The pt is c/o pain in the lt side of his neck  And lt shoulder for one week.  Dizziness Friday.  Some dull lt sided chest pain also

## 2015-03-15 NOTE — ED Provider Notes (Signed)
CSN: [id number]     Arrival date & time 03/15/15  1812 History   First MD Initiated Contact with Patient 03/15/15 1857     Chief Complaint  Patient presents with  . Neck Pain     (Consider location/radiation/quality/duration/timing/severity/associated sxs/prior Treatment) HPI Jose [name] is a 59 y.o. male with hx of HTN who presents to ED with complaint of left-sided neck, chest, facial pain, dizziness 2 days ago, generalized malaise. Patient states symptoms started gradually over last couple days. He states his pain has been constant, rates it as. Of 10. He denies any neurological deficit changes in vision. He states last time he felt similar to this is when he was diagnosed with diverticulitis, however this time he denies any abdominal pain. He has not taken any medications for this. He denies any fever. He states that his face feels flushed. He denies any medications that he is on right now. He denies any history of cardiac problems. He states he had an MRI several years ago that showed a small stroke, however he denies any strokelike symptoms. No other complaints at this time. Patient also notes that his blood pressure has been high, however he has been off of his blood pressure medications for several months because "I did not need them any longer, I lost some weight, and my blood pressure was normal."    Past Medical History  Diagnosis Date  . Hypertension   . GERD (gastroesophageal reflux disease)   . Hiatal hernia   . Chronic kidney disease     kidney stones  . Sleep apnea     Asked to bring mask and tubing for procedure  . Dizziness 11/04/2013    ENT eval reassuring.   . Palpitations 11/04/2013  . Abnormal ECG 11/04/2013    Q waves noted in 3, aVF, poor R wave progression   Past Surgical History  Procedure Laterality Date  . Kid stones    . Shoulder arthroscopy  10/18/2011    Procedure: ARTHROSCOPY SHOULDER;  Surgeon: Hessie Dibble, MD;  Location: [facility];  Service: Orthopedics;  Laterality: Right;  right shoulder arthroscopy , acromioplasty, partial DCR and rotator cuff repair  . Lithotripsy    . Eus N/A 01/11/2013    Procedure: ESOPHAGEAL ENDOSCOPIC ULTRASOUND (EUS) RADIAL;  Surgeon: Arta Silence, MD;  Location: [facility];  Service: Endoscopy;  Laterality: N/A;  radial scope  will bring h&p   Family History  Problem Relation Age of Onset  . Other Mother     Brain Tumor  . Heart attack Father   . Diabetes Father   . Gastric cancer Father   . Coronary artery disease Father   . Thyroid nodules Sister   . CVA Brother   . Diabetes Brother   . Thyroid nodules Sister    History  Substance Use Topics  . Smoking status: Never Smoker   . Smokeless tobacco: Never Used  . Alcohol Use: Yes     Comment: Rarely    Review of Systems  Constitutional: Positive for fatigue. Negative for fever and chills.  HENT: Negative for congestion, ear pain, facial swelling, hearing loss and sore throat.   Respiratory: Positive for chest tightness. Negative for cough and shortness of breath.   Cardiovascular: Negative for chest pain, palpitations and leg swelling.  Gastrointestinal: Negative for nausea, vomiting, abdominal pain, diarrhea and abdominal distention.  Genitourinary: Negative for dysuria, urgency, frequency and hematuria.  Musculoskeletal: Negative for myalgias, arthralgias, neck pain and neck  stiffness.  Skin: Negative for rash.  Allergic/Immunologic: Negative for immunocompromised state.  Neurological: Positive for weakness. Negative for dizziness, light-headedness, numbness and headaches.  All other systems reviewed and are negative.     Allergies  Lisinopril; Sulfa antibiotics; and Penicillins  Home Medications   Prior to Admission medications   Medication Sig Start Date End Date Taking? Authorizing Provider  aspirin 81 MG tablet Take 81 mg by mouth daily.     Yes Historical Provider, MD  Cholecalciferol (VITAMIN D-3)  1000 UNITS CAPS Take by mouth daily.   Yes Historical Provider, MD   BP 173/98 mmHg  Pulse 64  Temp(Src) 98.1 F (36.7 C)  Resp 11  Ht 5\' 4"  (1.626 m)  Wt 160 lb (72.576 kg)  BMI 27.45 kg/m2  SpO2 100% Physical Exam  Constitutional: He is oriented to person, place, and time. He appears well-developed and well-nourished. No distress.  HENT:  Head: Normocephalic and atraumatic.  Right Ear: External ear normal.  Left Ear: External ear normal.  Nose: Nose normal.  Mouth/Throat: Oropharynx is clear and moist.  TMs normal bilaterally  Eyes: Conjunctivae and EOM are normal. Pupils are equal, round, and reactive to light.  Neck: Normal range of motion and full passive range of motion without pain. Neck supple. No JVD present. Tracheal tenderness present. Carotid bruit is not present. No rigidity. No edema and no erythema present.    Cardiovascular: Normal rate, regular rhythm and normal heart sounds.   Pulmonary/Chest: Effort normal. No stridor. No respiratory distress. He has no wheezes. He has no rales.  Abdominal: Soft. Bowel sounds are normal. He exhibits no distension. There is no tenderness. There is no rebound.  Musculoskeletal: Normal range of motion. He exhibits no edema.  Neurological: He is alert and oriented to person, place, and time.  Skin: Skin is warm and dry.  Nursing note and vitals reviewed.   ED Course  Procedures (including critical care time) Labs Review Labs Reviewed  BASIC METABOLIC PANEL - Abnormal; Notable for the following:    Glucose, Bld 100 (*)    Anion gap 3 (*)    All other components within normal limits  CBC WITH DIFFERENTIAL/PLATELET - Abnormal; Notable for the following:    Platelets 137 (*)    All other components within normal limits  TROPONIN I    Imaging Review Dg Chest 2 View  03/15/2015   CLINICAL DATA:  Chest pain. Cough. LEFT shoulder and mandibular pain for 1 week.  EXAM: CHEST  2 VIEW  COMPARISON:  None.  FINDINGS:  Cardiopericardial silhouette within normal limits. Mediastinal contours normal. Trachea midline. No airspace disease or effusion. Marginal osteophytes project over the mid thoracic spine.  IMPRESSION: No active cardiopulmonary disease.   Electronically Signed   By: Dereck Ligas M.D.   On: 03/15/2015 19:51     EKG Interpretation   Date/Time:  Sunday March 15 2015 18:17:09 EDT Ventricular Rate:  71 PR Interval:  140 QRS Duration: 84 QT Interval:  378 QTC Calculation: 410 R Axis:   15 Text Interpretation:  Normal sinus rhythm Low voltage QRS Borderline ECG  No significant change since last tracing Confirmed by HARRISON  MD,  FORREST ([id number]) on 03/15/2015 6:23:35 PM      MDM   Final diagnoses:  Neck pain    Patient with left-sided neck discomfort, radiating into the left face, left shoulder, left chest. This pain has been constant for several days.  Patient is hypertensive with blood pressure 187/113, given symptoms  and physical exam, concerning for possible carotid dissection. Will get CT angiogram of the head and brain. His labs otherwise unremarkable, chest x-ray is negative. Will monitor.  11:30 PM CT angio negative, except for findings of thyroid nodule, left chronic mastoid fluid, which pt aware about and would not explain pt's symptoms. At this time, highly unlikely acs given constant pain for several days and negative ECG and trop. Pt has an apt with Dr. Laurann Montana tomorrow. Will follow up with him. Return precautions discussed.   Filed Vitals:   03/15/15 2100 03/15/15 2115 03/15/15 2130 03/15/15 2145  BP: 145/80 148/81 146/81 139/80  Pulse: 61 57 59 61  Temp:      Resp:      Height:      Weight:      SpO2: 100% 98% 99% 99%     Jeannett Senior, PA-C 03/16/15 0037  Pamella Pert, MD 03/17/15 2136

## 2015-03-15 NOTE — ED Notes (Signed)
Patient transported to CT 

## 2015-03-16 NOTE — ED Notes (Signed)
Patient has an appointment with his PCP tomorrow and understands the discharge instructions. He plans to follow up as directed. Denies current pain but continues to have discomfort in the neck area. Request to ambulate to the lobby, gait is steady.

## 2015-06-23 ENCOUNTER — Other Ambulatory Visit: Payer: Self-pay | Admitting: Internal Medicine

## 2015-06-23 DIAGNOSIS — E041 Nontoxic single thyroid nodule: Secondary | ICD-10-CM

## 2015-06-25 ENCOUNTER — Ambulatory Visit
Admission: RE | Admit: 2015-06-25 | Discharge: 2015-06-25 | Disposition: A | Discharge status: Home / Self Care | Payer: BC Managed Care – PPO | Source: Ambulatory Visit | Attending: Internal Medicine | Admitting: Internal Medicine

## 2015-06-25 DIAGNOSIS — E041 Nontoxic single thyroid nodule: Secondary | ICD-10-CM

## 2015-07-23 ENCOUNTER — Other Ambulatory Visit (HOSPITAL_COMMUNITY): Payer: Self-pay | Admitting: Otolaryngology

## 2015-07-23 ENCOUNTER — Other Ambulatory Visit: Payer: Self-pay | Admitting: Otolaryngology

## 2015-07-23 DIAGNOSIS — D497 Neoplasm of unspecified behavior of endocrine glands and other parts of nervous system: Secondary | ICD-10-CM

## 2015-08-05 ENCOUNTER — Other Ambulatory Visit: Payer: Self-pay | Admitting: Otolaryngology

## 2015-08-13 ENCOUNTER — Other Ambulatory Visit: Payer: BC Managed Care – PPO

## 2015-08-20 ENCOUNTER — Encounter (HOSPITAL_COMMUNITY)
Admission: RE | Admit: 2015-08-20 | Discharge: 2015-08-20 | Disposition: A | Discharge status: Home / Self Care | Payer: BC Managed Care – PPO | Source: Ambulatory Visit | Attending: Otolaryngology | Admitting: Otolaryngology

## 2015-08-20 ENCOUNTER — Encounter (HOSPITAL_COMMUNITY): Payer: Self-pay

## 2015-08-20 DIAGNOSIS — G4733 Obstructive sleep apnea (adult) (pediatric): Secondary | ICD-10-CM | POA: Insufficient documentation

## 2015-08-20 DIAGNOSIS — Z01818 Encounter for other preprocedural examination: Secondary | ICD-10-CM | POA: Insufficient documentation

## 2015-08-20 DIAGNOSIS — Z96611 Presence of right artificial shoulder joint: Secondary | ICD-10-CM | POA: Insufficient documentation

## 2015-08-20 DIAGNOSIS — I10 Essential (primary) hypertension: Secondary | ICD-10-CM | POA: Insufficient documentation

## 2015-08-20 DIAGNOSIS — Z01812 Encounter for preprocedural laboratory examination: Secondary | ICD-10-CM | POA: Insufficient documentation

## 2015-08-20 DIAGNOSIS — Z8673 Personal history of transient ischemic attack (TIA), and cerebral infarction without residual deficits: Secondary | ICD-10-CM | POA: Diagnosis not present

## 2015-08-20 DIAGNOSIS — Z7982 Long term (current) use of aspirin: Secondary | ICD-10-CM | POA: Diagnosis not present

## 2015-08-20 DIAGNOSIS — D351 Benign neoplasm of parathyroid gland: Secondary | ICD-10-CM | POA: Diagnosis not present

## 2015-08-20 DIAGNOSIS — K219 Gastro-esophageal reflux disease without esophagitis: Secondary | ICD-10-CM | POA: Diagnosis not present

## 2015-08-20 HISTORY — DX: Other specified postprocedural states: R11.2

## 2015-08-20 HISTORY — DX: Other specified postprocedural states: Z98.890

## 2015-08-20 HISTORY — DX: Abnormal results of liver function studies: R94.5

## 2015-08-20 HISTORY — DX: Other complications of anesthesia, initial encounter: T88.59XA

## 2015-08-20 HISTORY — DX: Angina pectoris, unspecified: I20.9

## 2015-08-20 HISTORY — DX: Fatty (change of) liver, not elsewhere classified: K76.0

## 2015-08-20 HISTORY — DX: Adverse effect of unspecified anesthetic, initial encounter: T41.45XA

## 2015-08-20 HISTORY — DX: Transient cerebral ischemic attack, unspecified: G45.9

## 2015-08-20 HISTORY — DX: Other specified abnormal findings of blood chemistry: R79.89

## 2015-08-20 HISTORY — DX: Unspecified osteoarthritis, unspecified site: M19.90

## 2015-08-20 LAB — CBC
HCT: 45.1 % (ref 39.0–52.0)
Hemoglobin: 16.2 g/dL (ref 13.0–17.0)
MCH: 31.5 pg (ref 26.0–34.0)
MCHC: 35.9 g/dL (ref 30.0–36.0)
MCV: 87.7 fL (ref 78.0–100.0)
PLATELETS: 123 10*3/uL — AB (ref 150–400)
RBC: 5.14 MIL/uL (ref 4.22–5.81)
RDW: 12.8 % (ref 11.5–15.5)
WBC: 7.3 10*3/uL (ref 4.0–10.5)

## 2015-08-20 LAB — BASIC METABOLIC PANEL
ANION GAP: 7 (ref 5–15)
BUN: 14 mg/dL (ref 6–20)
CO2: 23 mmol/L (ref 22–32)
Calcium: 9.3 mg/dL (ref 8.9–10.3)
Chloride: 106 mmol/L (ref 101–111)
Creatinine, Ser: 0.89 mg/dL (ref 0.61–1.24)
Glucose, Bld: 96 mg/dL (ref 65–99)
POTASSIUM: 4.4 mmol/L (ref 3.5–5.1)
SODIUM: 136 mmol/L (ref 135–145)

## 2015-08-20 LAB — GLUCOSE, CAPILLARY: GLUCOSE-CAPILLARY: 83 mg/dL (ref 65–99)

## 2015-08-20 NOTE — Progress Notes (Signed)
ECHO: 11/22/13 EKG: 03/17/15  Both in EPIC.  Patient states he has had a stress test done over 5 years ago but cannot remember where.  Will attempt to request records from Irven Shelling, MD (pcp).   Patient is positive for sleep apnea but states he has lost a lot of weight and does not wear CPAP at night anymore.

## 2015-08-20 NOTE — Pre-Procedure Instructions (Signed)
    Jose [name]  08/20/2015      [id number] - [place], [place] - [address] [place] [address] Phone: [phone] Fax: [phone]    Your procedure is scheduled on Wednesday August 26 2015 at 830 am.  Report to [facility] at 630 A.M.  Call this number if you have problems the morning of surgery:  [phone]   Remember:  Do not eat food or drink liquids after midnight Tuesday Sept 20th.  Take these medicines the morning of surgery with A SIP OF WATER none   STOP: ALL Vitamins, Supplements, Effient and Herbal Medications, Fish Oils, Aspirins,  NSAIDs (Nonsteroidal Anti-inflammatories such as Ibuprofen, Aleve, or Advil), and Goody's/BC  Powders 7 days (stop today 08/20/2015) prior to surgery, until after surgery as directed by your  physician.     Do not wear jewelry.  Do not wear lotions, powders, or perfumes.  You may wear deodorant.  Do not shave 48 hours prior to surgery.   Do not bring valuables to the hospital.  [facility] is not responsible for any belongings or valuables.  Contacts, dentures or bridgework may not be worn into surgery.  Leave your suitcase in the car.  After surgery it may be brought to your room.  For patients admitted to the hospital, discharge time will be determined by your treatment team.  Patients discharged the day of surgery will not be allowed to drive home.   Special instructions:  Please follow these instructions carefully:  1. Shower with CHG Soap the night before surgery and the morning of Surgery. 2. If you choose to wash your hair, wash your hair first as usual with your normal shampoo. 3. After you shampoo, rinse your hair and body thoroughly to remove the Shampoo. 4. Use CHG as you would any other liquid soap. You can apply chg directly to the skin and wash gently with scrungie or a  clean washcloth. 5. Apply the CHG Soap to your body ONLY FROM THE NECK DOWN. Do not use on open wounds or open sores. Avoid contact with your eyes, ears, mouth and genitals (private parts). Wash genitals (private parts) with your normal soap. 6. Wash thoroughly, paying special attention to the area where your surgery will be performed. 7. Thoroughly rinse your body with warm water from the neck down. 8. DO NOT shower/wash with your normal soap after using and rinsing off the CHG Soap. 9. Pat yourself dry with a clean towel.  10. Wear clean pajamas.  11. Place clean sheets on your bed the night of your first shower and do not sleep with pets.  Day of Surgery  Do not apply any lotions/deodorants the morning of surgery. Please wear clean clothes to the hospital/surgery center.    Please read over the following fact sheets that you were given. Pain Booklet, Coughing and Deep Breathing and Surgical Site Infection Prevention

## 2015-08-21 ENCOUNTER — Encounter (HOSPITAL_COMMUNITY): Payer: Self-pay

## 2015-08-21 LAB — HEPATIC FUNCTION PANEL
ALBUMIN: 4.6 g/dL (ref 3.5–5.0)
ALK PHOS: 70 U/L (ref 38–126)
ALT: 19 U/L (ref 17–63)
AST: 31 U/L (ref 15–41)
BILIRUBIN TOTAL: 2.4 mg/dL — AB (ref 0.3–1.2)
Bilirubin, Direct: 0.4 mg/dL (ref 0.1–0.5)
Indirect Bilirubin: 2 mg/dL — ABNORMAL HIGH (ref 0.3–0.9)
Total Protein: 7.1 g/dL (ref 6.5–8.1)

## 2015-08-21 NOTE — Progress Notes (Signed)
Anesthesia Chart Review:  Pt is 59 year old male scheduled for R parathyroid removal on 08/26/2015 with Dr. Benjamine Mola.   PCP is Dr. Lavone Orn.   PMH includes: HTN, palpitations, TIA, OSA (lost weight, not on CPAP), fatty liver (elevated LFTs), GERD. Never smoker. BMI 28. S/p R shoulder arthroplasty 10/18/11.   Anesthesia history includes post-op N/V. Also pt had right sided chest pain and loss of R-sided breast sounds after regional block for shoulder surgery 2012. CXR 10/19/11 demonstrated elevation of R hemidiaphragm and R basilar atelectasis. No pleural effusion or pneumothorax seen.   Medications include: ASA.   Preoperative labs reviewed.  Platelets 123.   Chest x-ray 03/15/2015 reviewed. No active cardiopulmonary disease.   EKG 03/15/2015: NSR. Low voltage QRS.   Echo 11/22/2013:  -Left ventricle: The cavity size was normal. Systolic function was normal. The estimated ejection fraction was in the range of 60% to 65%. Wall motion was normal; there were no regional wall motion abnormalities. Doppler parameters are consistent with abnormal left ventricular relaxation (grade 1 diastolic dysfunction).  Exercise Stress Test 03/23/2011: -[facility] treadmill score of 9.0 indicates low risk -THR achieved during exercise and pt had no symptoms.  -No EKG changes suggestive of ischemia  If no changes, I anticipate pt can proceed with surgery as scheduled.   Willeen Cass, FNP-BC [facility]/Anesthesiology Phone: [phone] 08/21/2015 2:00 PM

## 2015-08-26 ENCOUNTER — Observation Stay (HOSPITAL_COMMUNITY)
Admission: RE | Admit: 2015-08-26 | Discharge: 2015-08-27 | Disposition: A | Discharge status: Home / Self Care | Payer: BC Managed Care – PPO | Source: Ambulatory Visit | Attending: Otolaryngology | Admitting: Otolaryngology

## 2015-08-26 ENCOUNTER — Ambulatory Visit (HOSPITAL_COMMUNITY): Payer: BC Managed Care – PPO | Admitting: Vascular Surgery

## 2015-08-26 ENCOUNTER — Encounter (HOSPITAL_COMMUNITY): Payer: Self-pay | Admitting: General Practice

## 2015-08-26 ENCOUNTER — Encounter (HOSPITAL_COMMUNITY)
Admission: RE | Disposition: A | Discharge status: Home / Self Care | Payer: Self-pay | Source: Ambulatory Visit | Attending: Otolaryngology

## 2015-08-26 ENCOUNTER — Ambulatory Visit (HOSPITAL_COMMUNITY): Payer: BC Managed Care – PPO | Admitting: Anesthesiology

## 2015-08-26 DIAGNOSIS — I10 Essential (primary) hypertension: Secondary | ICD-10-CM | POA: Insufficient documentation

## 2015-08-26 DIAGNOSIS — E119 Type 2 diabetes mellitus without complications: Secondary | ICD-10-CM | POA: Diagnosis not present

## 2015-08-26 DIAGNOSIS — E063 Autoimmune thyroiditis: Secondary | ICD-10-CM | POA: Diagnosis not present

## 2015-08-26 DIAGNOSIS — K219 Gastro-esophageal reflux disease without esophagitis: Secondary | ICD-10-CM | POA: Insufficient documentation

## 2015-08-26 DIAGNOSIS — E892 Postprocedural hypoparathyroidism: Secondary | ICD-10-CM

## 2015-08-26 DIAGNOSIS — E041 Nontoxic single thyroid nodule: Principal | ICD-10-CM | POA: Insufficient documentation

## 2015-08-26 DIAGNOSIS — Z9889 Other specified postprocedural states: Secondary | ICD-10-CM

## 2015-08-26 HISTORY — PX: PARATHYROIDECTOMY: SHX19

## 2015-08-26 HISTORY — PX: THYROIDECTOMY: SHX17

## 2015-08-26 SURGERY — THYROIDECTOMY
Anesthesia: General | Site: Neck | Laterality: Right

## 2015-08-26 MED ORDER — PROMETHAZINE HCL 25 MG/ML IJ SOLN: 6.2500 mg | INTRAMUSCULAR | Status: DC | PRN

## 2015-08-26 MED ORDER — PROPOFOL 10 MG/ML IV BOLUS
INTRAVENOUS | Status: AC
  Filled 2015-08-26: qty 20

## 2015-08-26 MED ORDER — MORPHINE SULFATE (PF) 2 MG/ML IV SOLN: 2.0000 mg | INTRAVENOUS | Status: DC | PRN

## 2015-08-26 MED ORDER — CLINDAMYCIN PHOSPHATE 600 MG/50ML IV SOLN
600.0000 mg | Freq: Once | INTRAVENOUS | Status: AC
  Administered 2015-08-26: 600 mg via INTRAVENOUS
  Filled 2015-08-26: qty 50

## 2015-08-26 MED ORDER — MIDAZOLAM HCL 2 MG/2ML IJ SOLN
INTRAMUSCULAR | Status: AC
  Filled 2015-08-26: qty 4

## 2015-08-26 MED ORDER — MIDAZOLAM HCL 5 MG/5ML IJ SOLN
INTRAMUSCULAR | Status: DC | PRN
  Administered 2015-08-26: 2 mg via INTRAVENOUS

## 2015-08-26 MED ORDER — LACTATED RINGERS IV SOLN
INTRAVENOUS | Status: DC | PRN
  Administered 2015-08-26: 08:00:00 via INTRAVENOUS

## 2015-08-26 MED ORDER — SODIUM CHLORIDE 0.9 % IJ SOLN
INTRAMUSCULAR | Status: AC
  Filled 2015-08-26: qty 10

## 2015-08-26 MED ORDER — ONDANSETRON HCL 4 MG/2ML IJ SOLN
INTRAMUSCULAR | Status: AC
  Filled 2015-08-26: qty 2

## 2015-08-26 MED ORDER — LIDOCAINE-EPINEPHRINE 1 %-1:100000 IJ SOLN
INTRAMUSCULAR | Status: AC
  Filled 2015-08-26: qty 1

## 2015-08-26 MED ORDER — ZOLPIDEM TARTRATE 5 MG PO TABS
5.0000 mg | ORAL_TABLET | Freq: Every evening | ORAL | Status: DC | PRN
  Administered 2015-08-26 – 2015-08-27 (×2): 5 mg via ORAL
  Filled 2015-08-26 (×2): qty 1

## 2015-08-26 MED ORDER — CLINDAMYCIN HCL 300 MG PO CAPS: 300.0000 mg | ORAL_CAPSULE | Freq: Three times a day (TID) | ORAL | Status: DC

## 2015-08-26 MED ORDER — LIDOCAINE HCL (CARDIAC) 20 MG/ML IV SOLN
INTRAVENOUS | Status: AC
  Filled 2015-08-26: qty 5

## 2015-08-26 MED ORDER — DEXAMETHASONE SODIUM PHOSPHATE 10 MG/ML IJ SOLN
INTRAMUSCULAR | Status: DC | PRN
  Administered 2015-08-26: 8 mg via INTRAVENOUS

## 2015-08-26 MED ORDER — FENTANYL CITRATE (PF) 250 MCG/5ML IJ SOLN
INTRAMUSCULAR | Status: AC
  Filled 2015-08-26: qty 5

## 2015-08-26 MED ORDER — FENTANYL CITRATE (PF) 100 MCG/2ML IJ SOLN
INTRAMUSCULAR | Status: DC | PRN
  Administered 2015-08-26 (×2): 100 ug via INTRAVENOUS

## 2015-08-26 MED ORDER — OXYCODONE-ACETAMINOPHEN 5-325 MG PO TABS: 1.0000 | ORAL_TABLET | ORAL | Status: DC | PRN

## 2015-08-26 MED ORDER — PROMETHAZINE HCL 25 MG PO TABS: 25.0000 mg | ORAL_TABLET | Freq: Four times a day (QID) | ORAL | Status: DC | PRN

## 2015-08-26 MED ORDER — PROPOFOL 10 MG/ML IV BOLUS
INTRAVENOUS | Status: DC | PRN
  Administered 2015-08-26: 200 mg via INTRAVENOUS

## 2015-08-26 MED ORDER — EPHEDRINE SULFATE 50 MG/ML IJ SOLN
INTRAMUSCULAR | Status: AC
  Filled 2015-08-26: qty 1

## 2015-08-26 MED ORDER — PROMETHAZINE HCL 25 MG RE SUPP: 25.0000 mg | Freq: Four times a day (QID) | RECTAL | Status: DC | PRN

## 2015-08-26 MED ORDER — EPHEDRINE SULFATE 50 MG/ML IJ SOLN
INTRAMUSCULAR | Status: DC | PRN
  Administered 2015-08-26: 10 mg via INTRAVENOUS

## 2015-08-26 MED ORDER — DEXAMETHASONE SODIUM PHOSPHATE 4 MG/ML IJ SOLN
INTRAMUSCULAR | Status: AC
  Filled 2015-08-26: qty 1

## 2015-08-26 MED ORDER — SUCCINYLCHOLINE CHLORIDE 20 MG/ML IJ SOLN
INTRAMUSCULAR | Status: DC | PRN
  Administered 2015-08-26: 120 mg via INTRAVENOUS

## 2015-08-26 MED ORDER — ROCURONIUM BROMIDE 50 MG/5ML IV SOLN
INTRAVENOUS | Status: AC
  Filled 2015-08-26: qty 1

## 2015-08-26 MED ORDER — LIDOCAINE HCL (CARDIAC) 20 MG/ML IV SOLN
INTRAVENOUS | Status: DC | PRN
  Administered 2015-08-26: 60 mg via INTRAVENOUS

## 2015-08-26 MED ORDER — HYDROMORPHONE HCL 1 MG/ML IJ SOLN: 0.2500 mg | INTRAMUSCULAR | Status: DC | PRN

## 2015-08-26 MED ORDER — ONDANSETRON HCL 4 MG/2ML IJ SOLN
INTRAMUSCULAR | Status: DC | PRN
  Administered 2015-08-26: 4 mg via INTRAVENOUS

## 2015-08-26 MED ORDER — CLOBETASOL PROPIONATE 0.05 % EX CREA: 1.0000 "application " | TOPICAL_CREAM | Freq: Every day | CUTANEOUS | Status: DC | PRN

## 2015-08-26 MED ORDER — KCL IN DEXTROSE-NACL 20-5-0.45 MEQ/L-%-% IV SOLN
INTRAVENOUS | Status: AC
  Filled 2015-08-26: qty 1000

## 2015-08-26 MED ORDER — SUCCINYLCHOLINE CHLORIDE 20 MG/ML IJ SOLN
INTRAMUSCULAR | Status: AC
  Filled 2015-08-26: qty 1

## 2015-08-26 MED ORDER — KCL IN DEXTROSE-NACL 20-5-0.45 MEQ/L-%-% IV SOLN
INTRAVENOUS | Status: DC
  Administered 2015-08-26 – 2015-08-27 (×3): via INTRAVENOUS
  Filled 2015-08-26 (×2): qty 1000

## 2015-08-26 MED ORDER — LIDOCAINE-EPINEPHRINE 1 %-1:100000 IJ SOLN
INTRAMUSCULAR | Status: DC | PRN
  Administered 2015-08-26: 4 mL

## 2015-08-26 MED ORDER — 0.9 % SODIUM CHLORIDE (POUR BTL) OPTIME
TOPICAL | Status: DC | PRN
  Administered 2015-08-26: 1000 mL

## 2015-08-26 SURGICAL SUPPLY — 52 items
ADH SKN CLS APL DERMABOND .7 (GAUZE/BANDAGES/DRESSINGS) ×1
ATTRACTOMAT 16X20 MAGNETIC DRP (DRAPES) ×1 IMPLANT
BLADE SURG 15 STRL LF DISP TIS (BLADE) IMPLANT
BLADE SURG 15 STRL SS (BLADE)
BLADE SURG CLIPPER 3M 9600 (MISCELLANEOUS) IMPLANT
CANISTER SUCTION 2500CC (MISCELLANEOUS) ×2 IMPLANT
CLEANER TIP ELECTROSURG 2X2 (MISCELLANEOUS) ×3 IMPLANT
CLIP TI WIDE RED SMALL 24 (CLIP) IMPLANT
CONT SPEC 4OZ CLIKSEAL STRL BL (MISCELLANEOUS) ×1 IMPLANT
CORDS BIPOLAR (ELECTRODE) ×2 IMPLANT
COVER SURGICAL LIGHT HANDLE (MISCELLANEOUS) ×2 IMPLANT
CRADLE DONUT ADULT HEAD (MISCELLANEOUS) ×2 IMPLANT
DERMABOND ADVANCED (GAUZE/BANDAGES/DRESSINGS) ×1
DERMABOND ADVANCED .7 DNX12 (GAUZE/BANDAGES/DRESSINGS) ×1 IMPLANT
DRAIN CHANNEL 10F 3/8 F FF (DRAIN) ×2 IMPLANT
DRAPE PROXIMA HALF (DRAPES) ×2 IMPLANT
ELECT COATED BLADE 2.86 ST (ELECTRODE) ×2 IMPLANT
ELECT PAIRED SUBDERMAL (MISCELLANEOUS) ×2
ELECT REM PT RETURN 9FT ADLT (ELECTROSURGICAL) ×2
ELECTRODE PAIRED SUBDERMAL (MISCELLANEOUS) IMPLANT
ELECTRODE REM PT RTRN 9FT ADLT (ELECTROSURGICAL) ×1 IMPLANT
EVACUATOR SILICONE 100CC (DRAIN) ×2 IMPLANT
FORCEPS BIPOLAR SPETZLER 8 1.0 (NEUROSURGERY SUPPLIES) ×2 IMPLANT
GAUZE SPONGE 4X4 16PLY XRAY LF (GAUZE/BANDAGES/DRESSINGS) ×2 IMPLANT
GLOVE BIO SURGEON STRL SZ 6.5 (GLOVE) ×2 IMPLANT
GLOVE BIOGEL PI IND STRL 6.5 (GLOVE) ×1 IMPLANT
GLOVE BIOGEL PI IND STRL 7.0 (GLOVE) IMPLANT
GLOVE BIOGEL PI INDICATOR 6.5 (GLOVE) ×2
GLOVE BIOGEL PI INDICATOR 7.0 (GLOVE) ×1
GLOVE ECLIPSE 7.5 STRL STRAW (GLOVE) ×3 IMPLANT
GLOVE SURG SS PI 7.0 STRL IVOR (GLOVE) ×1 IMPLANT
GOWN STRL REUS W/ TWL LRG LVL3 (GOWN DISPOSABLE) ×3 IMPLANT
GOWN STRL REUS W/TWL LRG LVL3 (GOWN DISPOSABLE) ×6
HEMOSTAT SURGICEL 2X14 (HEMOSTASIS) IMPLANT
KIT BASIN OR (CUSTOM PROCEDURE TRAY) ×2 IMPLANT
KIT ROOM TURNOVER OR (KITS) ×2 IMPLANT
NDL HYPO 25GX1X1/2 BEV (NEEDLE) ×1 IMPLANT
NEEDLE HYPO 25GX1X1/2 BEV (NEEDLE) ×2 IMPLANT
NS IRRIG 1000ML POUR BTL (IV SOLUTION) ×2 IMPLANT
PAD ARMBOARD 7.5X6 YLW CONV (MISCELLANEOUS) ×2 IMPLANT
PENCIL BUTTON HOLSTER BLD 10FT (ELECTRODE) ×2 IMPLANT
PROBE NERVBE PRASS .33 (MISCELLANEOUS) ×2 IMPLANT
SHEARS HARMONIC 9CM CVD (BLADE) ×2 IMPLANT
SPONGE INTESTINAL PEANUT (DISPOSABLE) ×2 IMPLANT
SUT ETHILON 2 0 FS 18 (SUTURE) ×2 IMPLANT
SUT SILK 2 0 FS (SUTURE) ×2 IMPLANT
SUT SILK 3 0 REEL (SUTURE) ×2 IMPLANT
SUT VICRYL 4-0 PS2 18IN ABS (SUTURE) ×3 IMPLANT
TAPE CLOTH 4X10 WHT NS (GAUZE/BANDAGES/DRESSINGS) ×2 IMPLANT
TRAY ENT MC OR (CUSTOM PROCEDURE TRAY) ×2 IMPLANT
TRAY FOLEY CATH 14FRSI W/METER (CATHETERS) IMPLANT
TUBE ENDOTRAC EMG 7X10.2 (MISCELLANEOUS) ×1 IMPLANT

## 2015-08-26 NOTE — Op Note (Signed)
DATE OF PROCEDURE:  08/26/2015                              OPERATIVE REPORT  SURGEON:  Leta Baptist, MD  ASSISTANT: Rometta Emery, PA-C  PREOPERATIVE DIAGNOSES: 1. Right parathyroid mass  POSTOPERATIVE DIAGNOSES: 1. Right parathyroid mass  PROCEDURE PERFORMED:  Excision of right parathyroid mass.  ANESTHESIA:  General endotracheal tube anesthesia.  COMPLICATIONS:  None.  ESTIMATED BLOOD LOSS:  Less than 10 ml  INDICATION FOR PROCEDURE:  [name] is a 59 y.o. male who was recently noted incidentally on his CT angiography to have a large soft tissue mass posterior to the right thyroid lobe. The patient was asymptomatic. He has no significant dysphagia, odynophagia, dysphonia, or dyspnea. The mass was approximately 2-1/2 cm in diameter. The radiographic appearance and location of the soft tissue mass was suggestive of a parathyroid adenoma. Based on the above findings, the patient would like to have the mass removed. The risks, benefits, alternatives, and details of the procedure were discussed with the patient.  Questions were invited and answered.  Informed consent was obtained.  DESCRIPTION:  The patient was taken to the operating room and placed supine on the operating table.  General endotracheal tube anesthesia was administered by the anesthesiologist.  The patient was positioned and prepped and draped in a standard fashion for parathyroidectomy surgery.  Preop IV antibiotics was given. 1% lidocaine with 1-100,000 epinephrine was infiltrated at the planned site of incision. A NIMS monitoring endotracheal tube was used. The monitoring system was functional throughout the case.  A transverse lower neck incision was made. The incision was carried down to the level of the platysma muscles. Superior and inferiorly based subplatysmal flaps were elevated in the standard fashion. The strap muscles were divided at midline and retracted laterally, exposing the thyroid gland. The right  thyroid lobe was carefully dissected free from the surrounding soft tissue. At this point, a 2-1/2 cm soft tissue mass was noted posterior to the right thyroid lobe. The soft tissue mass was distinct from the thyroid gland. The entire soft tissue mass was then dissected free from the surrounding soft tissue. The right recurrent laryngeal nerve was identified and preserved. The nerve was noted to be functional at the end of the case. The specimen was sent to the pathology department for permanent histologic identification.  The care of the patient was turned over to the anesthesiologist.  The patient was awakened from anesthesia without difficulty.  He was extubated and transferred to the recovery room in good condition.  OPERATIVE FINDINGS:  A 2-1/2 cm right parathyroid mass was removed.  SPECIMEN:  Right parathyroid mass.  FOLLOWUP CARE:  The patient will be observed overnight in the hospital. He will most likely be discharged home on postop day #1. The patient will follow up in my office in approximately 1 week.  TEOH,SUI W 08/26/2015 9:45 AM

## 2015-08-26 NOTE — Anesthesia Postprocedure Evaluation (Signed)
  Anesthesia Post-op Note  Patient: Jose [name]  Procedure(s) Performed: Procedure(s): RIGHT PARATHYROID REMOVAL (Right)  Patient Location: PACU  Anesthesia Type:General  [name] of Consciousness: awake  Airway and Oxygen Therapy: Patient Spontanous Breathing  Post-op Pain: mild  Post-op Assessment: Post-op Vital signs reviewed              Post-op Vital Signs: Reviewed  Last Vitals:  Filed Vitals:   08/26/15 1015  BP: 147/80  Pulse: 85  Temp: 36.5 C  Resp: 12    Complications: No apparent anesthesia complications

## 2015-08-26 NOTE — Discharge Instructions (Signed)
Parathyroidectomy Care After Refer to this sheet in the next few weeks. These instructions provide you with general information on caring for yourself after you leave the hospital. Your caregiver also may give you specific instructions. Your treatment has been planned according to the most current medical practices available, but problems sometimes occur. Call your caregiver if you have any problems or questions after your procedure. HOME CARE INSTRUCTIONS   It is normal to be sore for a few weeks following surgery. See your caregiver if your pain seems to be getting worse rather than better.  You may resume a normal diet and activities as directed by your caregiver.  Avoid lifting weight greater than 20 lb (9 kg) or participating in heavy exercise or contact sports for 10 days or as instructed by your caregiver.  Make an appointment to see your caregiver for stitch (suture) or staple removal. SEEK MEDICAL CARE IF:   You have increased bleeding from your wound.  You have redness, swelling, or increasing pain from your wound or in your neck.  There is pus coming from your wound.  You have an oral temperature above 102 F (38.9 C).  There is a bad smell coming from the wound or dressing.  You develop lightheadedness or feel faint.  You develop numbness, tingling, or muscle spasms in your arms, hands, feet, or face.  You have difficulty swallowing. SEEK IMMEDIATE MEDICAL CARE IF:   You develop a rash.  You have difficulty breathing.  You hear whistling noises that come from your chest.  You develop a cough that becomes increasingly worse.  You develop any reaction or side effects to medicines given.  There is swelling in your neck.  You develop changes in speech or hoarseness, which is getting worse. MAKE SURE YOU:   Understand these instructions.  Will watch your condition.  Will get help right away if you are not doing well or get worse. Document Released:  06/10/2005 Document Revised: 02/13/2012 Document Reviewed: 01/28/2011 [facility] Patient Information 2015 [place], [place]. This information is not intended to replace advice given to you by your health care provider. Make sure you discuss any questions you have with your health care provider.

## 2015-08-26 NOTE — Anesthesia Procedure Notes (Addendum)
Procedure Name: Intubation Date/Time: 08/26/2015 8:32 AM Performed by: Laretta Alstrom Pre-anesthesia Checklist: Patient identified Patient Re-evaluated:Patient Re-evaluated prior to inductionOxygen Delivery Method: Circle system utilized Preoxygenation: Pre-oxygenation with 100% oxygen Intubation Type: IV induction Ventilation: Mask ventilation without difficulty Laryngoscope Size: Mac and 3 Grade View: Grade I Tube type: Oral Tube size: 7.0 mm Number of attempts: 1 Airway Equipment and Method: Stylet Placement Confirmation: ETT inserted through vocal cords under direct vision,  positive ETCO2 and breath sounds checked- equal and bilateral Secured at: 22 cm Tube secured with: Tape Dental Injury: Teeth and Oropharynx as per pre-operative assessment  Comments: Performed by Berna Spare, SRNA   Performed by: Laretta Alstrom

## 2015-08-26 NOTE — Anesthesia Preprocedure Evaluation (Signed)
Anesthesia Evaluation  Patient identified by MRN, date of birth, ID band Patient awake    Reviewed: Allergy & Precautions, NPO status , Patient's Chart, lab work & pertinent test results  History of Anesthesia Complications (+) PONV  Airway Mallampati: II  TM Distance: >3 FB Neck ROM: Full    Dental   Pulmonary sleep apnea ,    breath sounds clear to auscultation       Cardiovascular hypertension, + angina  Rhythm:Regular Rate:Normal     Neuro/Psych    GI/Hepatic Neg liver ROS, GERD  ,  Endo/Other  diabetes  Renal/GU      Musculoskeletal   Abdominal   Peds  Hematology   Anesthesia Other Findings   Reproductive/Obstetrics                             Anesthesia Physical Anesthesia Plan  ASA: III  Anesthesia Plan: General   Post-op Pain Management:    Induction: Intravenous  Airway Management Planned: Oral ETT  Additional Equipment:   Intra-op Plan:   Post-operative Plan: Possible Post-op intubation/ventilation  Informed Consent: I have reviewed the patients History and Physical, chart, labs and discussed the procedure including the risks, benefits and alternatives for the proposed anesthesia with the patient or authorized representative who has indicated his/her understanding and acceptance.   Dental advisory given  Plan Discussed with: Anesthesiologist, CRNA and Surgeon  Anesthesia Plan Comments:         Anesthesia Quick Evaluation

## 2015-08-26 NOTE — Progress Notes (Signed)
Report given to maria rn as caregiver 

## 2015-08-26 NOTE — Transfer of Care (Signed)
Immediate Anesthesia Transfer of Care Note  Patient: Jose [name]  Procedure(s) Performed: Procedure(s): RIGHT PARATHYROID REMOVAL (Right)  Patient Location: PACU  Anesthesia Type:General  [name] of Consciousness: awake, alert , oriented and patient cooperative  Airway & Oxygen Therapy: Patient Spontanous Breathing and Patient connected to face mask oxygen  Post-op Assessment: Report given to RN and Post -op Vital signs reviewed and stable  Post vital signs: Reviewed and stable  Last Vitals:  Filed Vitals:   08/26/15 0647  BP: 164/88  Pulse: 67  Temp: 36.7 C  Resp: 20    Complications: No apparent anesthesia complications

## 2015-08-26 NOTE — H&P (Signed)
Cc: Right neck mass  HPI: The patient is a 59 y/o male who presents today for evaluation of a right neck mass. The patient recently was seen at the ER with concern for a possible MI. He underwent a CT angiogram with incidental findings of right neck mass. A neck ultrasound showed a 2.6 cm nodule posterior to the right thyroid lobe. The nodule was felt to be separate from the thyroid and possible represent a parathyroid adenoma. The patient is currently asymptomatic. No dysphagia, odyophagia, or dysphonia is noted. He has no history of tobacco use or previous ENT surgery.   The patient's review of systems (constitutional, eyes, ENT, cardiovascular, respiratory, GI, musculoskeletal, skin, neurologic, psychiatric, endocrine, hematologic, allergic) is noted in the ROS questionnaire.  It is reviewed with the patient.   Family health history: None.   Major events: Shoulder surgery.   Ongoing medical problems: Hypertension.   Social history: The patient is married. He denies the use of alcohol, tobacco or illegal drugs.  Exam General: Communicates without difficulty, well nourished, no acute distress. Head: Normocephalic, no evidence injury, no tenderness, facial buttresses intact without stepoff. Eyes: PERRL, EOMI. No scleral icterus, conjunctivae clear. Neuro: CN II exam reveals vision grossly intact.  No nystagmus at any point of gaze. Ears: Auricles well formed without lesions.  Ear canals are intact without mass or lesion.  No erythema or edema is appreciated.  The TMs are intact without fluid. Nose: External evaluation reveals normal support and skin without lesions.  Dorsum is intact.  Anterior rhinoscopy reveals healthy pink mucosa over anterior aspect of inferior turbinates and intact septum.  No purulence noted. Oral:  Oral cavity and oropharynx are intact, symmetric, without erythema or edema.  Mucosa is moist without lesions. Neck: Full range of motion without pain.  There is no significant  lymphadenopathy.  No masses palpable.  Thyroid bed within normal limits to palpation.  Parotid glands and submandibular glands equal bilaterally without mass.  Trachea is midline. Neuro:  CN 2-12 grossly intact. Gait normal. Vestibular: No nystagmus at any point of gaze. The cerebellar examination is unremarkable.   Procedure:  Flexible Fiberoptic Laryngoscopy -- Risks, benefits, and alternatives of flexible endoscopy were explained to the patient.  Specific mention was made of the risk of throat numbness with difficulty swallowing, possible bleeding from the nose and mouth, and pain from the procedure.  The patient gave oral consent to proceed.  The nasal cavities were decongested and anesthetised with a combination of oxymetazoline and 4% lidocaine solution.  The flexible scope was inserted into the right nasal cavity and advanced towards the nasopharynx.  Visualized mucosa over the turbinates and septum were as described above.  The nasopharynx was clear.  Oropharyngeal walls were symmetric and mobile without lesion, mass, or edema.  Hypopharynx was also without  lesion or edema.  Larynx was mobile without lesions. Supraglottic structures were free of edema, mass, and asymmetry.  True vocal folds were white and mobile bilaterally.  Base of tongue was within normal limits.  The patient tolerated the procedure well.   Assessment 1.  A 2.6 cm right nodule is noted posterior to the right thyroid lobe which could possibly represent a parathyroid adenoma.  2.  The patient is currently asymptomatic. 3.  Vocal cords are mobile on today's laryngoscopy exam.   Plan 1.  The ultrasound and CT findings are reviewed in detail with the patient. 2.  Treatment options included observation, FNA, or surgical excision. The risks, benefits, alternatives, and  details of the procedure are reviewed with the patient. Questions are invited and answered. 3.  The patient would like to proceed with surgical excision.

## 2015-08-27 ENCOUNTER — Encounter (HOSPITAL_COMMUNITY): Payer: Self-pay | Admitting: Otolaryngology

## 2015-08-27 DIAGNOSIS — E041 Nontoxic single thyroid nodule: Secondary | ICD-10-CM | POA: Diagnosis not present

## 2015-08-27 NOTE — Discharge Summary (Signed)
Physician Discharge Summary  Patient ID: MAXIME BECKNER MRN: [id number] DOB/AGE: 08-15-1956 59 y.o.  Admit date: 08/26/2015 Discharge date: 08/27/2015  Admission Diagnoses: Right parathyroid mass  Discharge Diagnoses: Right parathyroid mass Active Problems:   S/P parathyroidectomy   Discharged Condition: good  Hospital Course: Pt had an uneventful overnight stay. Pt tolerated po well. No bleeding. No stridor. Voice is strong.  Consults: None  Significant Diagnostic Studies: None  Treatments: surgery: Excision of right parathyroid mass  Discharge Exam: Blood pressure 144/80, pulse 87, temperature 98.4 F (36.9 C), temperature source Oral, resp. rate 16, height 5\' 5"  (1.651 m), weight 79.379 kg (175 lb), SpO2 100 %. Incision/Wound:c/d/i Voice is strong  Disposition: 01-Home or Self Care  Discharge Instructions    Activity as tolerated - No restrictions    Complete by:  As directed      Diet general    Complete by:  As directed             Medication List    STOP taking these medications        aspirin 81 MG tablet      TAKE these medications        clindamycin 300 MG capsule  Commonly known as:  CLEOCIN  Take 1 capsule (300 mg total) by mouth 3 (three) times daily.     Clobetasol Propionate 0.05 % external spray  Commonly known as:  TEMOVATE  Apply 1 application topically daily as needed (apply to psoriasis as needed.).     Fluticasone Propionate 0.05 % Lotn  Apply topically as needed.     metroNIDAZOLE 0.75 % cream  Commonly known as:  METROCREAM  Apply 1 application topically as needed.     oxyCODONE-acetaminophen 5-325 MG per tablet  Commonly known as:  ROXICET  Take 1 tablet by mouth every 4 (four) hours as needed for severe pain.     Vitamin D-3 1000 UNITS Caps  Take by mouth daily.           Follow-up Information    Follow up with Ascencion Dike, MD On 09/02/2015.   Specialty:  Otolaryngology   Why:  at 2:50pm   Contact information:   [address] [place] [address] [phone]       Signed: Ascencion Dike 08/27/2015, 8:03 AM

## 2015-08-28 LAB — PARATHYROID HORMONE, INTACT (NO CA): PTH: 52 pg/mL (ref 15–65)

## 2016-05-19 ENCOUNTER — Other Ambulatory Visit (INDEPENDENT_AMBULATORY_CARE_PROVIDER_SITE_OTHER): Payer: Self-pay | Admitting: Otolaryngology

## 2016-05-19 DIAGNOSIS — H65199 Other acute nonsuppurative otitis media, unspecified ear: Secondary | ICD-10-CM

## 2016-05-19 DIAGNOSIS — H905 Unspecified sensorineural hearing loss: Secondary | ICD-10-CM

## 2016-05-23 ENCOUNTER — Ambulatory Visit
Admission: RE | Admit: 2016-05-23 | Discharge: 2016-05-23 | Disposition: A | Discharge status: Home / Self Care | Payer: BC Managed Care – PPO | Source: Ambulatory Visit | Attending: Otolaryngology | Admitting: Otolaryngology

## 2016-05-23 DIAGNOSIS — H65199 Other acute nonsuppurative otitis media, unspecified ear: Secondary | ICD-10-CM

## 2016-05-23 DIAGNOSIS — H905 Unspecified sensorineural hearing loss: Secondary | ICD-10-CM

## 2016-06-08 ENCOUNTER — Encounter (HOSPITAL_COMMUNITY): Payer: Self-pay | Admitting: *Deleted

## 2016-06-08 ENCOUNTER — Emergency Department (HOSPITAL_COMMUNITY): Payer: BC Managed Care – PPO

## 2016-06-08 ENCOUNTER — Emergency Department (HOSPITAL_COMMUNITY)
Admission: EM | Admit: 2016-06-08 | Discharge: 2016-06-08 | Disposition: A | Discharge status: Home / Self Care | Payer: BC Managed Care – PPO | Attending: Emergency Medicine | Admitting: Emergency Medicine

## 2016-06-08 DIAGNOSIS — I129 Hypertensive chronic kidney disease with stage 1 through stage 4 chronic kidney disease, or unspecified chronic kidney disease: Secondary | ICD-10-CM | POA: Insufficient documentation

## 2016-06-08 DIAGNOSIS — N189 Chronic kidney disease, unspecified: Secondary | ICD-10-CM | POA: Diagnosis not present

## 2016-06-08 DIAGNOSIS — Z79899 Other long term (current) drug therapy: Secondary | ICD-10-CM | POA: Insufficient documentation

## 2016-06-08 DIAGNOSIS — R202 Paresthesia of skin: Secondary | ICD-10-CM | POA: Insufficient documentation

## 2016-06-08 DIAGNOSIS — Z8673 Personal history of transient ischemic attack (TIA), and cerebral infarction without residual deficits: Secondary | ICD-10-CM | POA: Insufficient documentation

## 2016-06-08 DIAGNOSIS — Z7982 Long term (current) use of aspirin: Secondary | ICD-10-CM | POA: Insufficient documentation

## 2016-06-08 LAB — BASIC METABOLIC PANEL
ANION GAP: 7 (ref 5–15)
BUN: 17 mg/dL (ref 6–20)
CHLORIDE: 106 mmol/L (ref 101–111)
CO2: 27 mmol/L (ref 22–32)
Calcium: 9.2 mg/dL (ref 8.9–10.3)
Creatinine, Ser: 0.91 mg/dL (ref 0.61–1.24)
GFR calc non Af Amer: >60 mL/min (ref >60)
Glucose, Bld: 102 mg/dL — ABNORMAL HIGH (ref 65–99)
POTASSIUM: 4.5 mmol/L (ref 3.5–5.1)
Sodium: 140 mmol/L (ref 135–145)

## 2016-06-08 LAB — I-STAT TROPONIN, ED: Troponin i, poc: 0 ng/mL (ref 0.00–0.08)

## 2016-06-08 LAB — CBC
HCT: 44.4 % (ref 39.0–52.0)
HEMOGLOBIN: 15.1 g/dL (ref 13.0–17.0)
MCH: 30.3 pg (ref 26.0–34.0)
MCHC: 34 g/dL (ref 30.0–36.0)
MCV: 89 fL (ref 78.0–100.0)
Platelets: 126 10*3/uL — ABNORMAL LOW (ref 150–400)
RBC: 4.99 MIL/uL (ref 4.22–5.81)
RDW: 12.7 % (ref 11.5–15.5)
WBC: 7.3 10*3/uL (ref 4.0–10.5)

## 2016-06-08 LAB — CBG MONITORING, ED: Glucose-Capillary: 98 mg/dL (ref 65–99)

## 2016-06-08 NOTE — ED Notes (Addendum)
Pt to ED c/o numbness in L hand and L foot. Pt reports following up with Dr.Teoh for fluid behind the L ear and vertigo. Pt woke up this morning with nausea and worsening tingling. Per wife some tingling has been intermittently present for a month. Denies pain, ambulatory with steady gait. No neuro deficits at present

## 2016-06-08 NOTE — Discharge Instructions (Signed)
Paresthesia Paresthesia is an abnormal burning or prickling sensation. This sensation is generally felt in the hands, arms, legs, or feet. However, it may occur in any part of the body. Usually, it is not painful. The feeling may be described as:  Tingling or numbness.  Pins and needles.  Skin crawling.  Buzzing.  Limbs falling asleep.  Itching. Most people experience temporary (transient) paresthesia at some time in their lives. Paresthesia may occur when you breathe too quickly (hyperventilation). It can also occur without any apparent cause. Commonly, paresthesia occurs when pressure is placed on a nerve. The sensation quickly goes away after the pressure is removed. For some people, however, paresthesia is a long-lasting (chronic) condition that is caused by an underlying disorder. If you continue to have paresthesia, you may need further medical evaluation. HOME CARE INSTRUCTIONS Watch your condition for any changes. Taking the following actions may help to lessen any discomfort that you are feeling:  Avoid drinking alcohol.  Try acupuncture or massage to help relieve your symptoms.  Keep all follow-up visits as directed by your health care provider. This is important. SEEK MEDICAL CARE IF:  You continue to have episodes of paresthesia.  Your burning or prickling feeling gets worse when you walk.  You have pain, cramps, or dizziness.  You develop a rash. SEEK IMMEDIATE MEDICAL CARE IF:  You feel weak.  You have trouble walking or moving.  You have problems with speech, understanding, or vision.  You feel confused.  You cannot control your bladder or bowel movements.  You have numbness after an injury.  You faint.   This information is not intended to replace advice given to you by your health care provider. Make sure you discuss any questions you have with your health care provider.   Document Released: 11/11/2002 Document Revised: 04/07/2015 Document Reviewed:  11/17/2014 [employer] Interactive Patient Education 2016 [employer]  

## 2016-06-08 NOTE — ED Provider Notes (Signed)
CSN: [id number]     Arrival date & time 06/08/16  0102 History   First MD Initiated Contact with Patient 06/08/16 0106     Chief Complaint  Patient presents with  . Tingling     (Consider location/radiation/quality/duration/timing/severity/associated sxs/prior Treatment) HPI  Patient to the ER with complaints of an episode of tingling to his whole body, heart racing, diaphoresis and nausea that started around 12am. It lasted a few minutes. He has an appointment with Dr. Benjamine Mola coming up today for evaluation of fluid behind his mastoid process. He has also been having over the past couple of months constant tingling to the tips of left distal fingers. He denies having any headache, fevers, neck pain, recent trauma or fall, back pain, bowel or urine incontinence.   Past Medical History  Diagnosis Date  . Hypertension   . GERD (gastroesophageal reflux disease)   . Chronic kidney disease     kidney stones  . Dizziness 11/04/2013    ENT eval reassuring.   . Palpitations 11/04/2013  . Abnormal ECG 11/04/2013    Q waves noted in 3, aVF, poor R wave progression  . Anginal pain ([place])   . TIA (transient ischemic attack)   . Sleep apnea     lost a lot of weight does not wear CPAP anymore  . Arthritis   . Elevated LFTs     chronic  . Fatty liver   . Complication of anesthesia     R sided chest pain as complication from a nerve block from shoulder surgery in 2012, had to go to ED. CXR showed R hemidiaphragm elevation and atelectasis.   Marland Kitchen PONV (postoperative nausea and vomiting)    Past Surgical History  Procedure Laterality Date  . Kid stones    . Shoulder arthroscopy  10/18/2011    Procedure: ARTHROSCOPY SHOULDER;  Surgeon: Hessie Dibble, MD;  Location: [facility];  Service: Orthopedics;  Laterality: Right;  right shoulder arthroscopy , acromioplasty, partial DCR and rotator cuff repair  . Lithotripsy    . Eus N/A 01/11/2013    Procedure: ESOPHAGEAL ENDOSCOPIC ULTRASOUND  (EUS) RADIAL;  Surgeon: Arta Silence, MD;  Location: [facility];  Service: Endoscopy;  Laterality: N/A;  radial scope  will bring h&p  . Eye surgery      retinal repair possibly L eye  . Multiple tooth extractions    . Colonoscopy    . Parathyroidectomy Right 08/26/2015  . Thyroidectomy Right 08/26/2015    Procedure: RIGHT PARATHYROID REMOVAL;  Surgeon: Leta Baptist, MD;  [facility];  Service: ENT;  Laterality: Right;   Family History  Problem Relation Age of Onset  . Other Mother     Brain Tumor  . Heart attack Father   . Diabetes Father   . Gastric cancer Father   . Coronary artery disease Father   . Thyroid nodules Sister   . CVA Brother   . Diabetes Brother   . Thyroid nodules Sister    Social History  Substance Use Topics  . Smoking status: Never Smoker   . Smokeless tobacco: Never Used  . Alcohol Use: Yes    Review of Systems  Review of Systems All other systems negative except as documented in the HPI. All pertinent positives and negatives as reviewed in the HPI.   Allergies  Lisinopril; Sulfa antibiotics; and Penicillins  Home Medications   Prior to Admission medications   Medication Sig Start Date End Date Taking? Authorizing Provider  amLODipine-valsartan Vivi Ferns)  5-160 MG tablet TAKE 1 TABLET BY MOUTH ONCE A DAY FOR 30 DAYS 05/24/16  Yes Historical Provider, MD  aspirin EC 81 MG tablet Take 81 mg by mouth daily.   Yes Historical Provider, MD  Cholecalciferol (VITAMIN D-3) 1000 UNITS CAPS Take by mouth daily.   Yes Historical Provider, MD  clindamycin (CLEOCIN) 300 MG capsule Take 1 capsule (300 mg total) by mouth 3 (three) times daily. Patient not taking: Reported on 06/08/2016 08/26/15   Leta Baptist, MD  oxyCODONE-acetaminophen (ROXICET) 5-325 MG per tablet Take 1 tablet by mouth every 4 (four) hours as needed for severe pain. Patient not taking: Reported on 06/08/2016 08/26/15   Leta Baptist, MD   BP 123/78 mmHg  Pulse 59  Temp(Src) 98 F (36.7 C) (Oral)  Resp 15   Ht 5\' 4"  (1.626 m)  Wt 77.111 kg  BMI 29.17 kg/m2  SpO2 98% Physical Exam  Constitutional: He appears well-developed and well-nourished. No distress.  HENT:  Head: Normocephalic and atraumatic.  Right Ear: Tympanic membrane and ear canal normal.  Left Ear: Tympanic membrane and ear canal normal.  Nose: Nose normal.  Mouth/Throat: Uvula is midline, oropharynx is clear and moist and mucous membranes are normal.  Eyes: Pupils are equal, round, and reactive to light.  Neck: Normal range of motion. Neck supple.  Cardiovascular: Normal rate and regular rhythm.   Pulmonary/Chest: Effort normal.  Abdominal: Soft.  No signs of abdominal distention  Musculoskeletal:  No LE swelling  Neurological: He is alert.  Acting at baseline  Skin: Skin is warm and dry. No rash noted.  Nursing note and vitals reviewed.   ED Course  Procedures (including critical care time) Labs Review Labs Reviewed  BASIC METABOLIC PANEL - Abnormal; Notable for the following:    Glucose, Bld 102 (*)    All other components within normal limits  CBC - Abnormal; Notable for the following:    Platelets 126 (*)    All other components within normal limits  CBG MONITORING, ED  Randolm Idol, ED    Imaging Review Dg Chest 2 View  06/08/2016  CLINICAL DATA:  LEFT hand tingling for weeks. History of hypertension. EXAM: CHEST  2 VIEW COMPARISON:  Chest radiograph March 15, 2015 FINDINGS: Cardiomediastinal silhouette is normal. Small calcified mediastinal lymph nodes. The lungs are clear without pleural effusions or focal consolidations. Trachea projects midline and there is no pneumothorax. Soft tissue planes and included osseous structures are non-suspicious. Mild degenerative change of thoracic spine. Trace calcification LEFT neck likely is vascular. IMPRESSION: No active cardiopulmonary disease. Electronically Signed   By: Elon Alas M.D.   On: 06/08/2016 03:01   I have personally reviewed and evaluated  these images and lab results as part of my medical decision-making.   EKG Interpretation   Date/Time:  Wednesday June 08 2016 01:12:25 EDT Ventricular Rate:  71 PR Interval:    QRS Duration: 90 QT Interval:  381 QTC Calculation: 414 R Axis:   1 Text Interpretation:  Sinus rhythm Low voltage, precordial leads Baseline  wander in lead(s) V6 No significant change since last tracing Confirmed by  POLLINA  MD, [place] [id number]) on 06/08/2016 1:55:52 AM      MDM   Final diagnoses:  Tingling    Pt did not have any chest pain during his stay. Hs work-up in the ED is unremarkable for any acute changes or abnormalities. I feel that he would benefit from seeing a neurologist for further evaluation of the tingling he  has been getting,. Glucose is 102. Patient is feeling well and well appearing.  Discussed case with Dr. Betsey Holiday who is in agreement with the plan.  Medications - No data to display  I discussed results, diagnoses and plan with Darnell Level. They voice there understanding and questions were answered. We discussed follow-up recommendations and return precautions.     Delos Haring, PA-C 06/08/16 [place], MD 06/08/16 [phone]

## 2016-06-22 ENCOUNTER — Ambulatory Visit: Payer: Self-pay | Admitting: Neurology

## 2016-07-05 ENCOUNTER — Ambulatory Visit (INDEPENDENT_AMBULATORY_CARE_PROVIDER_SITE_OTHER): Payer: BC Managed Care – PPO | Admitting: Neurology

## 2016-07-05 ENCOUNTER — Encounter: Payer: Self-pay | Admitting: Neurology

## 2016-07-05 VITALS — BP 147/90 | HR 76 | Ht 64.0 in | Wt 177.0 lb

## 2016-07-05 DIAGNOSIS — R202 Paresthesia of skin: Secondary | ICD-10-CM | POA: Diagnosis not present

## 2016-07-05 NOTE — Progress Notes (Signed)
Reason for visit: Paresthesias  Referring physician: [place]  Jose [name] is a 60 y.o. male  History of present illness:  Jose [name] is a 60 year old right-handed white male with a history of onset of some lightheaded sensations in April 2017. He was in bed at the time and had a shocklike sensation in the head, he got up and he has had some sensations of dizziness. The patient has chronic inflammation the left mastoid process, he has been followed by Dr. Benjamine Mola who has recommended surgery. The patient did undergo a CT scan the skull that confirmed ongoing inflammation in his area. The patient has had this at least for 3 or 4 years, this was well documented by MRI of the brain done in 2014. The patient began developing some numbness in the hands in late June 2017. This has gradually spread to the feet and toes, most notable at nighttime. The patient has had a sensation of being cognitively dull as well. The patient does not feel normal. He was seen in the emergency room on 06/08/2016 with diaphoresis, increased heart rate, and paresthesias all over the body. The episode lasted only a few minutes then cleared, but the patient has had baseline numbness on all 4 extremities. He denies any balance issues, or difficulty controlling the bowels or the bladder. He does have some mild chronic neck pain but this has not increased recently. He is sent to this office for an evaluation. He does have a history of psoriasis.  Past Medical History:  Diagnosis Date  . Abnormal ECG 11/04/2013   Q waves noted in 3, aVF, poor R wave progression  . Anginal pain ([place])   . Arthritis   . Chronic kidney disease    kidney stones  . Chronic left mastoiditis    Fluid behid left mastoid  . Complication of anesthesia    R sided chest pain as complication from a nerve block from shoulder surgery in 2012, had to go to ED. CXR showed R hemidiaphragm elevation and atelectasis.   . Dizziness 11/04/2013   ENT eval  reassuring.   . Elevated LFTs    chronic  . Fatty liver   . GERD (gastroesophageal reflux disease)   . Hypertension   . Palpitations 11/04/2013  . PONV (postoperative nausea and vomiting)   . Sleep apnea    lost a lot of weight does not wear CPAP anymore  . TIA (transient ischemic attack)     Past Surgical History:  Procedure Laterality Date  . COLONOSCOPY    . EUS N/A 01/11/2013   Procedure: ESOPHAGEAL ENDOSCOPIC ULTRASOUND (EUS) RADIAL;  Surgeon: Arta Silence, MD;  [facility]: [facility];  Service: Endoscopy;  Laterality: N/A;  radial scope  will bring h&p  . EYE SURGERY     retinal repair possibly L eye  . kid stones    . LITHOTRIPSY    . MULTIPLE TOOTH EXTRACTIONS    . PARATHYROIDECTOMY Right 08/26/2015  . SHOULDER ARTHROSCOPY  10/18/2011   Procedure: ARTHROSCOPY SHOULDER;  Surgeon: Hessie Dibble, MD;  Location: [facility];  Service: Orthopedics;  Laterality: Right;  right shoulder arthroscopy , acromioplasty, partial DCR and rotator cuff repair  . THYROIDECTOMY Right 08/26/2015   Procedure: RIGHT PARATHYROID REMOVAL;  Surgeon: Leta Baptist, MD;  Location[facility];  Service: ENT;  Laterality: Right;    Family History  Problem Relation Age of Onset  . Other Mother     Brain Tumor  . Heart attack  Father   . Diabetes Father   . Gastric cancer Father   . Coronary artery disease Father   . Thyroid nodules Sister   . CVA Brother   . Diabetes Brother   . Thyroid nodules Sister     Social history:  reports that he has never smoked. He has never used smokeless tobacco. He reports that he drinks alcohol. He reports that he does not use drugs.  Medications:  Prior to Admission medications   Medication Sig Start Date End Date Taking? Authorizing Provider  amLODipine-valsartan (EXFORGE) 5-160 MG tablet TAKE 1 TABLET BY MOUTH ONCE A DAY FOR 30 DAYS 05/24/16   Historical Provider, MD  aspirin EC 81 MG tablet Take 81 mg by mouth daily.    Historical Provider, MD    Cholecalciferol (VITAMIN D-3) 1000 UNITS CAPS Take by mouth daily.    Historical Provider, MD      Allergies  Allergen Reactions  . Lisinopril     dehydration  . Sulfa Antibiotics     nausea  . Penicillins Rash    ROS:  Out of a complete 14 system review of symptoms, the patient complains only of the following symptoms, and all other reviewed systems are negative.  Ringing in the ears Joint pain, joint swelling Dizziness  Blood pressure (!) 147/90, pulse 76, height 5\' 4"  (1.626 m), weight 177 lb (80.3 kg).  Physical Exam  General: The patient is alert and cooperative at the time of the examination.  Eyes: Pupils are equal, round, and reactive to light. Discs are flat bilaterally.  Neck: The neck is supple, no carotid bruits are noted.  Respiratory: The respiratory examination is clear.  Cardiovascular: The cardiovascular examination reveals a regular rate and rhythm, no obvious murmurs or rubs are noted.  Neuromuscular: Range of movement of the cervical spine is full.  Skin: Extremities are without significant edema.  Neurologic Exam  Mental status: The patient is alert and oriented x 3 at the time of the examination. The patient has apparent normal recent and remote memory, with an apparently normal attention span and concentration ability.  Cranial nerves: Facial symmetry is present. There is good sensation of the face to pinprick and soft touch bilaterally. The strength of the facial muscles and the muscles to head turning and shoulder shrug are normal bilaterally. Speech is well enunciated, no aphasia or dysarthria is noted. Extraocular movements are full. Visual fields are full. The tongue is midline, and the patient has symmetric elevation of the soft palate. No obvious hearing deficits are noted.  Motor: The motor testing reveals 5 over 5 strength of all 4 extremities. Good symmetric motor tone is noted throughout.  Sensory: Sensory testing is intact to  pinprick, soft touch, vibration sensation, and position sense on all 4 extremities. No evidence of extinction is noted.  Coordination: Cerebellar testing reveals good finger-nose-finger and heel-to-shin bilaterally.  Gait and station: Gait is normal. Tandem gait is normal. Romberg is negative. No drift is seen.  Reflexes: Deep tendon reflexes are symmetric and normal bilaterally. Toes are downgoing bilaterally.   Assessment/Plan:  1. Subjective paresthesias, all 4 extremities  2. Chronic inflammation, left mastoid process  The patient likely does have underlying anxiety disorder, but it is not clear that this is the etiology of all the symptoms. The patient has reported a change in sensation of all 4 extremities that is subjective, he reports a sensation of alteration of consciousness, and difficulty with cognitive functioning. The patient will be set up  for MRI of the brain, he apparently will need conscious sedation for this. The patient will have blood work done today. If the studies are unremarkable, he will be set up for EMG and nerve conduction study evaluation.  Jill Alexanders MD 07/05/2016 3:45 PM  [facility] [address] [place], [place] [address]  Phone [phone] Fax [phone]

## 2016-07-06 LAB — HIV ANTIBODY (ROUTINE TESTING W REFLEX): HIV SCREEN 4TH GENERATION: NONREACTIVE

## 2016-07-06 LAB — B. BURGDORFI ANTIBODIES: Lyme IgG/IgM Ab: <0.91 {ISR} (ref 0.00–0.90)

## 2016-07-06 LAB — VITAMIN B12: VITAMIN B 12: 462 pg/mL (ref 211–946)

## 2016-07-06 LAB — RPR: RPR: NONREACTIVE

## 2016-07-06 LAB — SEDIMENTATION RATE: Sed Rate: 3 mm/hr (ref 0–30)

## 2016-07-06 LAB — ANGIOTENSIN CONVERTING ENZYME: Angio Convert Enzyme: 24 U/L (ref 14–82)

## 2016-07-06 LAB — ANA W/REFLEX: ANA: NEGATIVE

## 2016-07-18 ENCOUNTER — Telehealth: Payer: Self-pay | Admitting: Neurology

## 2016-07-18 NOTE — Telephone Encounter (Signed)
Spoke with the patient today regarding scheduling his MRI and he requested to speak with the nurse to go over his lab results. He states that he has not heard back from anyone regarding these labs. Please call and advise.

## 2016-07-18 NOTE — Telephone Encounter (Signed)
Called pt w/ unremarkable lab results. Verbalized understanding and appreciation for call. 

## 2016-07-19 ENCOUNTER — Telehealth: Payer: Self-pay | Admitting: Neurology

## 2016-07-19 NOTE — Telephone Encounter (Signed)
Patient called to schedule MRI with sedation, prefers [facility], patient states it was mentioned that this could possible be done Friday afternoon. Please call.

## 2016-07-25 NOTE — Telephone Encounter (Signed)
I have notes in the order that I spoke with patient and informed him to call [facility] and schedule MRI. I spoke with patient today and informed him for the second time that he would have to call [facility] and schedule his apt. He stated that he had never spoken with me before and had never been told to call and schedule apt. I gave him the number for [facility] scheduling and he stated that he would call.

## 2016-08-03 ENCOUNTER — Encounter (HOSPITAL_COMMUNITY)
Admission: RE | Admit: 2016-08-03 | Discharge: 2016-08-03 | Disposition: A | Discharge status: Home / Self Care | Payer: BC Managed Care – PPO | Source: Ambulatory Visit | Attending: Neurology | Admitting: Neurology

## 2016-08-03 ENCOUNTER — Encounter (HOSPITAL_COMMUNITY): Payer: Self-pay

## 2016-08-03 DIAGNOSIS — I10 Essential (primary) hypertension: Secondary | ICD-10-CM | POA: Diagnosis not present

## 2016-08-03 DIAGNOSIS — Z8673 Personal history of transient ischemic attack (TIA), and cerebral infarction without residual deficits: Secondary | ICD-10-CM | POA: Diagnosis not present

## 2016-08-03 DIAGNOSIS — R209 Unspecified disturbances of skin sensation: Secondary | ICD-10-CM | POA: Diagnosis present

## 2016-08-03 HISTORY — DX: Cerebral infarction, unspecified: I63.9

## 2016-08-03 HISTORY — DX: Personal history of urinary calculi: Z87.442

## 2016-08-03 LAB — CBC
HCT: 46.4 % (ref 39.0–52.0)
Hemoglobin: 15.9 g/dL (ref 13.0–17.0)
MCH: 30.6 pg (ref 26.0–34.0)
MCHC: 34.3 g/dL (ref 30.0–36.0)
MCV: 89.2 fL (ref 78.0–100.0)
PLATELETS: 135 10*3/uL — AB (ref 150–400)
RBC: 5.2 MIL/uL (ref 4.22–5.81)
RDW: 12.9 % (ref 11.5–15.5)
WBC: 8.3 10*3/uL (ref 4.0–10.5)

## 2016-08-03 LAB — BASIC METABOLIC PANEL
Anion gap: 8 (ref 5–15)
BUN: 15 mg/dL (ref 6–20)
CALCIUM: 9.7 mg/dL (ref 8.9–10.3)
CO2: 24 mmol/L (ref 22–32)
CREATININE: 0.89 mg/dL (ref 0.61–1.24)
Chloride: 104 mmol/L (ref 101–111)
GFR calc non Af Amer: >60 mL/min (ref >60)
GLUCOSE: 108 mg/dL — AB (ref 65–99)
Potassium: 4.2 mmol/L (ref 3.5–5.1)
Sodium: 136 mmol/L (ref 135–145)

## 2016-08-03 NOTE — Pre-Procedure Instructions (Addendum)
    Jose [name]  08/03/2016      [id number] - [address], [place] [address] Phone: [phone] Fax: [phone]    Your procedure is scheduled on 08/04/16.  Report to [facility] Admitting at 600 A.M.  Call this number if you have problems the morning of surgery:  [phone]   Remember:  Do not eat food or drink liquids after midnight.  Take these medicines the morning of surgery with A SIP OF WATER none  STOP all herbel meds, nsaids (aleve,naproxen,advil,ibuprofen) 5 days prior to surgery NOW including vitamins, aspirin,   Do not wear jewelry, make-up or nail polish.  Do not wear lotions, powders, or perfumes, or deoderant.  Do not shave 48 hours prior to surgery.  Men may shave face and neck.  Do not bring valuables to the hospital.  [facility] is not responsible for any belongings or valuables.  Contacts, dentures or bridgework may not be worn into surgery.  Leave your suitcase in the car.  After surgery it may be brought to your room.  For patients admitted to the hospital, discharge time will be determined by your treatment team.  Patients discharged the day of surgery will not be allowed to drive home.

## 2016-08-04 ENCOUNTER — Ambulatory Visit (HOSPITAL_COMMUNITY): Payer: BC Managed Care – PPO | Admitting: Anesthesiology

## 2016-08-04 ENCOUNTER — Telehealth: Payer: Self-pay | Admitting: Neurology

## 2016-08-04 ENCOUNTER — Encounter (HOSPITAL_COMMUNITY): Payer: Self-pay | Admitting: Anesthesiology

## 2016-08-04 ENCOUNTER — Ambulatory Visit (HOSPITAL_COMMUNITY)
Admission: RE | Admit: 2016-08-04 | Discharge: 2016-08-04 | Disposition: A | Discharge status: Home / Self Care | Payer: BC Managed Care – PPO | Source: Ambulatory Visit | Attending: Neurology | Admitting: Neurology

## 2016-08-04 ENCOUNTER — Encounter (HOSPITAL_COMMUNITY)
Admission: RE | Disposition: A | Discharge status: Home / Self Care | Payer: Self-pay | Source: Ambulatory Visit | Attending: Neurology

## 2016-08-04 DIAGNOSIS — R202 Paresthesia of skin: Secondary | ICD-10-CM

## 2016-08-04 DIAGNOSIS — Z8673 Personal history of transient ischemic attack (TIA), and cerebral infarction without residual deficits: Secondary | ICD-10-CM | POA: Insufficient documentation

## 2016-08-04 DIAGNOSIS — R209 Unspecified disturbances of skin sensation: Secondary | ICD-10-CM | POA: Insufficient documentation

## 2016-08-04 DIAGNOSIS — I10 Essential (primary) hypertension: Secondary | ICD-10-CM | POA: Insufficient documentation

## 2016-08-04 HISTORY — PX: RADIOLOGY WITH ANESTHESIA: SHX6223

## 2016-08-04 SURGERY — RADIOLOGY WITH ANESTHESIA
Anesthesia: General

## 2016-08-04 MED ORDER — FENTANYL CITRATE (PF) 100 MCG/2ML IJ SOLN
INTRAMUSCULAR | Status: DC | PRN
  Administered 2016-08-04: 25 ug via INTRAVENOUS

## 2016-08-04 MED ORDER — GADOBENATE DIMEGLUMINE 529 MG/ML IV SOLN
20.0000 mL | Freq: Once | INTRAVENOUS | Status: AC
  Administered 2016-08-04: 17 mL via INTRAVENOUS

## 2016-08-04 MED ORDER — ONDANSETRON HCL 4 MG/2ML IJ SOLN: 4.0000 mg | Freq: Once | INTRAMUSCULAR | Status: DC | PRN

## 2016-08-04 MED ORDER — FENTANYL CITRATE (PF) 100 MCG/2ML IJ SOLN: 25.0000 ug | INTRAMUSCULAR | Status: DC | PRN

## 2016-08-04 MED ORDER — LACTATED RINGERS IV SOLN
INTRAVENOUS | Status: DC | PRN
  Administered 2016-08-04: 08:00:00 via INTRAVENOUS

## 2016-08-04 MED ORDER — OXYCODONE HCL 5 MG/5ML PO SOLN: 5.0000 mg | Freq: Once | ORAL | Status: DC | PRN

## 2016-08-04 MED ORDER — MIDAZOLAM HCL 2 MG/2ML IJ SOLN
INTRAMUSCULAR | Status: DC | PRN
  Administered 2016-08-04: 2 mg via INTRAVENOUS

## 2016-08-04 MED ORDER — OXYCODONE HCL 5 MG PO TABS: 5.0000 mg | ORAL_TABLET | Freq: Once | ORAL | Status: DC | PRN

## 2016-08-04 NOTE — Anesthesia Preprocedure Evaluation (Addendum)
Anesthesia Evaluation  Patient identified by MRN, date of birth, ID band  Reviewed: Allergy & Precautions, NPO status , Patient's Chart, lab work & pertinent test results  Airway Mallampati: II  TM Distance: >3 FB Neck ROM: Full    Dental  (+) Teeth Intact, Dental Advisory Given   Pulmonary    breath sounds clear to auscultation       Cardiovascular hypertension,  Rhythm:Regular Rate:Normal     Neuro/Psych    GI/Hepatic   Endo/Other    Renal/GU      Musculoskeletal   Abdominal   Peds  Hematology   Anesthesia Other Findings   Reproductive/Obstetrics                            Anesthesia Physical Anesthesia Plan  ASA: III  Anesthesia Plan: General   Post-op Pain Management:    Induction: Intravenous  Airway Management Planned: LMA  Additional Equipment:   Intra-op Plan:   Post-operative Plan:   Informed Consent: I have reviewed the patients History and Physical, chart, labs and discussed the procedure including the risks, benefits and alternatives for the proposed anesthesia with the patient or authorized representative who has indicated his/her understanding and acceptance.   Dental advisory given  Plan Discussed with: CRNA and Anesthesiologist  Anesthesia Plan Comments:         Anesthesia Quick Evaluation

## 2016-08-04 NOTE — Telephone Encounter (Signed)
I called patient. MRI the brain shows no changes from 2014. The patient still is complaining of paresthesias all 4 extremities, I will get him set up for EMG and nerve conduction study evaluation.  We will do nerve conduction studies on both legs, one arm, EMG on one leg.   MRI brain 08/04/16:  IMPRESSION: 1. No acute intracranial abnormality or mass. 2. Unchanged chronic right caudate lacunar infarct. 3. Chronic left mastoid effusion.

## 2016-08-04 NOTE — Anesthesia Postprocedure Evaluation (Signed)
Anesthesia Post Note  Patient: Jose [name]  Procedure(s) Performed: Procedure(s) (LRB): MRI OF THE BRAIN WITH AND WITHOUT (N/A)  Patient location during evaluation: PACU Anesthesia Type: General Level of consciousness: awake, awake and alert and oriented Pain management: pain level controlled Vital Signs Assessment: post-procedure vital signs reviewed and stable Respiratory status: spontaneous breathing, nonlabored ventilation and respiratory function stable Cardiovascular status: blood pressure returned to baseline Anesthetic complications: no    Last Vitals:  Vitals:   08/04/16 1030 08/04/16 1038  BP: 139/78 (!) 148/91  Pulse: (!) 52 (!) 55  Resp: 15 16  Temp: 36.5 C     Last Pain:  Vitals:   08/04/16 1030  TempSrc:   PainSc: 0-No pain                 Tj Kitchings COKER

## 2016-08-04 NOTE — Transfer of Care (Signed)
Immediate Anesthesia Transfer of Care Note  Patient: Jose [name]  Procedure(s) Performed: Procedure(s): MRI OF THE BRAIN WITH AND WITHOUT (N/A)  Patient Location: PACU  Anesthesia Type:General  Level of Consciousness: awake, alert , oriented and patient cooperative  Airway & Oxygen Therapy: Patient Spontanous Breathing  Post-op Assessment: Report given to RN and Post -op Vital signs reviewed and stable  Post vital signs: Reviewed and stable  Last Vitals:  Vitals:   08/04/16 0641 08/04/16 1000  BP: (!) 141/89 132/80  Pulse: 65 60  Resp: 20 16  Temp: 37 C 36.6 C    Last Pain:  Vitals:   08/04/16 1000  TempSrc:   PainSc: 0-No pain         Complications: No apparent anesthesia complications

## 2016-08-05 ENCOUNTER — Encounter (HOSPITAL_COMMUNITY): Payer: Self-pay | Admitting: Radiology

## 2016-08-10 MED FILL — Lidocaine HCl IV Inj 20 MG/ML: INTRAVENOUS | Qty: 5 | Status: AC

## 2016-08-10 MED FILL — Glycopyrrolate Inj 0.2 MG/ML: INTRAMUSCULAR | Qty: 1 | Status: AC

## 2016-08-10 MED FILL — Propofol IV Emul 200 MG/20ML (10 MG/ML): INTRAVENOUS | Qty: 20 | Status: AC

## 2016-08-10 MED FILL — Midazolam HCl Inj 2 MG/2ML (Base Equivalent): INTRAMUSCULAR | Qty: 2 | Status: AC

## 2016-08-10 MED FILL — Fentanyl Citrate Preservative Free (PF) Inj 100 MCG/2ML: INTRAMUSCULAR | Qty: 2 | Status: AC

## 2016-08-10 MED FILL — Lactated Ringer's Solution: INTRAVENOUS | Qty: 1000 | Status: AC

## 2016-08-10 MED FILL — Ondansetron HCl Inj 4 MG/2ML (2 MG/ML): INTRAMUSCULAR | Qty: 2 | Status: AC

## 2016-08-12 ENCOUNTER — Telehealth: Payer: Self-pay | Admitting: Neurology

## 2016-08-12 NOTE — Telephone Encounter (Signed)
Noted, the study has been canceled.

## 2016-08-12 NOTE — Telephone Encounter (Signed)
FYI- Called pt to schedule NCV/EMG and pt advised after speaking to his wife and getting a good report on his MRI he thinks he is going to hold off on this study.  I advised pt that this study is very informative and would be looking at his nerves and muscles to see if they are functioning properly.  Pt stated he'd do some more research and let us know if he changed his mind.

## 2016-09-05 ENCOUNTER — Ambulatory Visit (HOSPITAL_COMMUNITY)
Admission: EM | Admit: 2016-09-05 | Discharge: 2016-09-05 | Disposition: A | Discharge status: Home / Self Care | Payer: BC Managed Care – PPO | Attending: Internal Medicine | Admitting: Internal Medicine

## 2016-09-05 ENCOUNTER — Encounter (HOSPITAL_COMMUNITY): Payer: Self-pay | Admitting: Emergency Medicine

## 2016-09-05 ENCOUNTER — Ambulatory Visit (INDEPENDENT_AMBULATORY_CARE_PROVIDER_SITE_OTHER): Payer: BC Managed Care – PPO

## 2016-09-05 DIAGNOSIS — R202 Paresthesia of skin: Secondary | ICD-10-CM

## 2016-09-05 DIAGNOSIS — R200 Anesthesia of skin: Secondary | ICD-10-CM

## 2016-09-05 DIAGNOSIS — M791 Myalgia: Secondary | ICD-10-CM

## 2016-09-05 DIAGNOSIS — M7918 Myalgia, other site: Secondary | ICD-10-CM

## 2016-09-05 MED ORDER — CYCLOBENZAPRINE HCL 10 MG PO TABS: 10.0000 mg | ORAL_TABLET | Freq: Every evening | ORAL | 0 refills | Status: DC | PRN
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
## 2017-01-24 ENCOUNTER — Other Ambulatory Visit: Payer: Self-pay | Admitting: Internal Medicine

## 2017-01-24 DIAGNOSIS — R1011 Right upper quadrant pain: Secondary | ICD-10-CM

## 2017-01-31 ENCOUNTER — Ambulatory Visit
Admission: RE | Admit: 2017-01-31 | Discharge: 2017-01-31 | Disposition: A | Discharge status: Home / Self Care | Payer: BC Managed Care – PPO | Source: Ambulatory Visit | Attending: Internal Medicine | Admitting: Internal Medicine

## 2017-01-31 DIAGNOSIS — R1011 Right upper quadrant pain: Secondary | ICD-10-CM

## 2017-03-20 ENCOUNTER — Other Ambulatory Visit: Payer: Self-pay | Admitting: Internal Medicine

## 2017-03-20 DIAGNOSIS — M545 Low back pain, unspecified: Secondary | ICD-10-CM

## 2017-03-23 ENCOUNTER — Other Ambulatory Visit: Payer: BC Managed Care – PPO

## 2017-03-27 ENCOUNTER — Ambulatory Visit
Admission: RE | Admit: 2017-03-27 | Discharge: 2017-03-27 | Disposition: A | Discharge status: Home / Self Care | Payer: BC Managed Care – PPO | Source: Ambulatory Visit | Attending: Internal Medicine | Admitting: Internal Medicine

## 2017-03-27 ENCOUNTER — Other Ambulatory Visit: Payer: BC Managed Care – PPO

## 2017-03-27 DIAGNOSIS — M545 Low back pain, unspecified: Secondary | ICD-10-CM

## 2017-03-27 MED ORDER — IOPAMIDOL (ISOVUE-300) INJECTION 61%
100.0000 mL | Freq: Once | INTRAVENOUS | Status: AC | PRN
  Administered 2017-03-27: 100 mL via INTRAVENOUS

## 2017-04-12 ENCOUNTER — Other Ambulatory Visit: Payer: Self-pay | Admitting: Orthopaedic Surgery

## 2017-04-12 DIAGNOSIS — T1590XA Foreign body on external eye, part unspecified, unspecified eye, initial encounter: Secondary | ICD-10-CM

## 2017-04-12 DIAGNOSIS — M545 Low back pain, unspecified: Secondary | ICD-10-CM

## 2017-11-06 ENCOUNTER — Other Ambulatory Visit: Payer: Self-pay | Admitting: Internal Medicine

## 2017-11-06 ENCOUNTER — Ambulatory Visit
Admission: RE | Admit: 2017-11-06 | Discharge: 2017-11-06 | Disposition: A | Discharge status: Home / Self Care | Payer: BC Managed Care – PPO | Source: Ambulatory Visit | Attending: Internal Medicine | Admitting: Internal Medicine

## 2017-11-06 DIAGNOSIS — M25561 Pain in right knee: Secondary | ICD-10-CM

## 2020-03-21 ENCOUNTER — Ambulatory Visit: Payer: BC Managed Care – PPO | Attending: Internal Medicine

## 2020-03-21 DIAGNOSIS — Z23 Encounter for immunization: Secondary | ICD-10-CM

## 2020-03-21 NOTE — Progress Notes (Signed)
   Covid-19 Vaccination Clinic  Name:  Jose [name]    MRN: [id number] DOB: 11/18/1956  03/21/2020  [name] was observed post Covid-19 immunization for 15 minutes without incident. He was provided with Vaccine Information Sheet and instruction to access the V-Safe system.   [name] was instructed to call 911 with any severe reactions post vaccine: Marland Kitchen Difficulty breathing  . Swelling of face and throat  . A fast heartbeat  . A bad rash all over body  . Dizziness and weakness   Immunizations Administered    Name Date Dose VIS Date Route   Pfizer COVID-19 Vaccine 03/21/2020  4:52 PM 0.3 mL 11/15/2019 Intramuscular   Manufacturer: [place]   Lot: [id number]   [place]: [id number]

## 2020-04-14 ENCOUNTER — Ambulatory Visit: Payer: BC Managed Care – PPO | Attending: Internal Medicine

## 2020-04-14 DIAGNOSIS — Z23 Encounter for immunization: Secondary | ICD-10-CM

## 2020-04-14 NOTE — Progress Notes (Signed)
   Covid-19 Vaccination Clinic  Name:  Jose [name]    MRN: [id number] DOB: Jul 04, 1956  04/14/2020  [name] was observed post Covid-19 immunization for 15 minutes without incident. He was provided with Vaccine Information Sheet and instruction to access the V-Safe system.   [name] was instructed to call 911 with any severe reactions post vaccine: Marland Kitchen Difficulty breathing  . Swelling of face and throat  . A fast heartbeat  . A bad rash all over body  . Dizziness and weakness   Immunizations Administered    Name Date Dose VIS Date Route   Pfizer COVID-19 Vaccine 04/14/2020 12:33 PM 0.3 mL 01/29/2019 Intramuscular   Manufacturer: [place]   Lot: [id number]   [place]: [id number]

## 2020-07-15 ENCOUNTER — Other Ambulatory Visit: Payer: Self-pay | Admitting: Internal Medicine

## 2020-07-15 DIAGNOSIS — E7800 Pure hypercholesterolemia, unspecified: Secondary | ICD-10-CM

## 2020-07-24 ENCOUNTER — Ambulatory Visit
Admission: RE | Admit: 2020-07-24 | Discharge: 2020-07-24 | Disposition: A | Discharge status: Home / Self Care | Payer: No Typology Code available for payment source | Source: Ambulatory Visit | Attending: Internal Medicine | Admitting: Internal Medicine

## 2020-07-24 DIAGNOSIS — E7800 Pure hypercholesterolemia, unspecified: Secondary | ICD-10-CM

## 2020-10-28 ENCOUNTER — Other Ambulatory Visit: Payer: Self-pay | Admitting: Internal Medicine

## 2020-10-28 DIAGNOSIS — R1031 Right lower quadrant pain: Secondary | ICD-10-CM

## 2020-11-17 ENCOUNTER — Inpatient Hospital Stay: Admission: RE | Admit: 2020-11-17 | Payer: BC Managed Care – PPO | Source: Ambulatory Visit

## 2021-04-01 ENCOUNTER — Other Ambulatory Visit: Payer: Self-pay | Admitting: Internal Medicine

## 2021-04-01 ENCOUNTER — Ambulatory Visit
Admission: RE | Admit: 2021-04-01 | Discharge: 2021-04-01 | Disposition: A | Discharge status: Home / Self Care | Payer: BC Managed Care – PPO | Source: Ambulatory Visit | Attending: Internal Medicine | Admitting: Internal Medicine

## 2021-04-01 DIAGNOSIS — M542 Cervicalgia: Secondary | ICD-10-CM

## 2021-07-16 ENCOUNTER — Ambulatory Visit: Payer: BC Managed Care – PPO | Admitting: Podiatry

## 2021-07-16 ENCOUNTER — Ambulatory Visit (INDEPENDENT_AMBULATORY_CARE_PROVIDER_SITE_OTHER): Payer: BC Managed Care – PPO

## 2021-07-16 ENCOUNTER — Encounter: Payer: Self-pay | Admitting: Podiatry

## 2021-07-16 ENCOUNTER — Other Ambulatory Visit: Payer: Self-pay

## 2021-07-16 DIAGNOSIS — M722 Plantar fascial fibromatosis: Secondary | ICD-10-CM | POA: Diagnosis not present

## 2021-07-16 MED ORDER — TRIAMCINOLONE ACETONIDE 10 MG/ML IJ SUSP
10.0000 mg | Freq: Once | INTRAMUSCULAR | Status: AC
Start: 2021-07-16 — End: 2021-07-16
  Administered 2021-07-16: 10 mg

## 2021-07-16 MED ORDER — DICLOFENAC SODIUM 75 MG PO TBEC: 75.0000 mg | DELAYED_RELEASE_TABLET | Freq: Two times a day (BID) | ORAL | 2 refills | Status: DC

## 2021-07-16 NOTE — Patient Instructions (Signed)

## 2021-07-19 NOTE — Progress Notes (Signed)
Subjective:   Patient ID: Jose [name], male   DOB: 65 y.o.   MRN: [id number]   HPI Patient presents with a lot of pain on the bottom of the right heel stating its been hurting at least 3 months and starting to hit into the back of the heel and side because he believes he is walking differently.  Patient does not remember any new activities tries to stay active and does not smoke   Review of Systems  All other systems reviewed and are negative.      Objective:  Physical Exam Vitals and nursing note reviewed.  Constitutional:      Appearance: He is well-developed.  Pulmonary:     Effort: Pulmonary effort is normal.  Musculoskeletal:        General: Normal range of motion.  Skin:    General: Skin is warm.  Neurological:     Mental Status: He is alert.    Neurovascular status intact muscle strength adequate range of motion within normal limits with patient found to have exquisite discomfort plantar aspect right heel at the insertional point of the tendon into the calcaneus with inflammation fluid moderate discomfort posterior heel lateral side of the foot secondary to compensatory pain.  Patient is found to have good digital perfusion well oriented x3     Assessment:  Acute fasciitis-like symptomatology right with probable compensation and gait 8     Plan:  NP reviewed condition sterile prep and injected the plantar fascial right 3 mg Kenalog 5 mg Xylocaine and applied fascial brace.  Gave instructions for stretching exercises physical therapy shoe gear modification reappoint to recheck and placed on oral anti-inflammatory  X-ray indicates small spur no indication stress fracture or advanced arthritis

## 2021-07-30 ENCOUNTER — Ambulatory Visit: Payer: BC Managed Care – PPO | Admitting: Podiatry

## 2021-11-24 ENCOUNTER — Ambulatory Visit
Admission: RE | Admit: 2021-11-24 | Discharge: 2021-11-24 | Disposition: A | Discharge status: Home / Self Care | Payer: BC Managed Care – PPO | Source: Ambulatory Visit | Attending: Internal Medicine | Admitting: Internal Medicine

## 2021-11-24 ENCOUNTER — Other Ambulatory Visit: Payer: Self-pay | Admitting: Internal Medicine

## 2021-11-24 DIAGNOSIS — L4050 Arthropathic psoriasis, unspecified: Secondary | ICD-10-CM

## 2022-01-11 ENCOUNTER — Ambulatory Visit: Payer: BC Managed Care – PPO | Admitting: Cardiology

## 2022-03-03 ENCOUNTER — Other Ambulatory Visit: Payer: Self-pay | Admitting: Internal Medicine

## 2022-03-03 ENCOUNTER — Ambulatory Visit
Admission: RE | Admit: 2022-03-03 | Discharge: 2022-03-03 | Disposition: A | Discharge status: Home / Self Care | Payer: BC Managed Care – PPO | Source: Ambulatory Visit | Attending: Internal Medicine | Admitting: Internal Medicine

## 2022-03-03 DIAGNOSIS — R052 Subacute cough: Secondary | ICD-10-CM

## 2022-04-05 ENCOUNTER — Ambulatory Visit: Payer: BC Managed Care – PPO | Admitting: Interventional Cardiology

## 2022-04-05 ENCOUNTER — Encounter: Payer: Self-pay | Admitting: Interventional Cardiology

## 2022-04-05 VITALS — BP 144/96 | HR 80 | Ht 64.0 in | Wt 189.0 lb

## 2022-04-05 DIAGNOSIS — I10 Essential (primary) hypertension: Secondary | ICD-10-CM

## 2022-04-05 DIAGNOSIS — E66811 Obesity, class 1: Secondary | ICD-10-CM

## 2022-04-05 DIAGNOSIS — I2584 Coronary atherosclerosis due to calcified coronary lesion: Secondary | ICD-10-CM

## 2022-04-05 DIAGNOSIS — M79602 Pain in left arm: Secondary | ICD-10-CM

## 2022-04-05 DIAGNOSIS — R072 Precordial pain: Secondary | ICD-10-CM | POA: Diagnosis not present

## 2022-04-05 DIAGNOSIS — E669 Obesity, unspecified: Secondary | ICD-10-CM

## 2022-04-05 DIAGNOSIS — I2510 Atherosclerotic heart disease of native coronary artery without angina pectoris: Secondary | ICD-10-CM

## 2022-04-05 DIAGNOSIS — Z789 Other specified health status: Secondary | ICD-10-CM

## 2022-04-05 LAB — BASIC METABOLIC PANEL
BUN/Creatinine Ratio: 18 (ref 10–24)
BUN: 18 mg/dL (ref 8–27)
CO2: 23 mmol/L (ref 20–29)
Calcium: 9.7 mg/dL (ref 8.6–10.2)
Chloride: 102 mmol/L (ref 96–106)
Creatinine, Ser: 1 mg/dL (ref 0.76–1.27)
Glucose: 126 mg/dL — ABNORMAL HIGH (ref 70–99)
Potassium: 4.6 mmol/L (ref 3.5–5.2)
Sodium: 138 mmol/L (ref 134–144)
eGFR: 84 mL/min/{1.73_m2} (ref >59)

## 2022-04-05 MED ORDER — METOPROLOL TARTRATE 100 MG PO TABS: ORAL_TABLET | ORAL | 0 refills | Status: DC

## 2022-04-05 NOTE — Patient Instructions (Addendum)
Medication Instructions:  ?Your physician recommends that you continue on your current medications as directed. Please refer to the Current Medication list given to you today. ? ?*If you need a refill on your cardiac medications before your next appointment, please call your pharmacy* ? ? ?Lab Work: ?Lab work to be done today--BMP ?If you have labs (blood work) drawn today and your tests are completely normal, you will receive your results only by: ?MyChart Message (if you have MyChart) OR ?A paper copy in the mail ?If you have any lab test that is abnormal or we need to change your treatment, we will call you to review the results. ? ? ?Testing/Procedures: ?Your physician has requested that you have cardiac CT. Cardiac computed tomography (CT) is a painless test that uses an x-ray machine to take clear, detailed pictures of your heart. For further information please visit [email or web address]. Please follow instruction sheet as given. ? ? ? ? ?Follow-Up: ?At [facility], you and your health needs are our priority.  As part of our continuing mission to provide you with exceptional heart care, we have created designated Provider Care Teams.  These Care Teams include your primary Cardiologist (physician) and Advanced Practice Providers (APPs -  Physician Assistants and Nurse Practitioners) who all work together to provide you with the care you need, when you need it. ? ?We recommend signing up for the patient portal called "MyChart".  Sign up information is provided on this After Visit Summary.  MyChart is used to connect with patients for Virtual Visits (Telemedicine).  Patients are able to view lab/test results, encounter notes, upcoming appointments, etc.  Non-urgent messages can be sent to your provider as well.   ?To learn more about what you can do with MyChart, go to [email or web address].   ? ?Your next appointment:   ?12 month(s) ? ?The format for your next appointment:   ?In Person ? ?Provider:    ?Larae Grooms, MD   ? ? ?Other Instructions ? ? ?Your cardiac CT will be scheduled at one of the below locations:  ? ?[facility] ?[address] ?[place], [place] [address] ?[phone] ? ?OR ? ?[place] [address] ?[place], [place] [address] ?[phone] ? ?If scheduled at [facility], please arrive at the [facility] (Entrance C2) of [facility] 30 minutes prior to test start time. ?You can use the FREE valet parking offered at entrance C (encouraged to control the heart rate for the test)  ?Proceed to the [facility] Radiology Department (first floor) to check-in and test prep. ? ?All radiology patients and guests should use entrance C2 at [facility], accessed from [facility], even though the hospital's physical address listed is [address] ? ? ? ?If scheduled at [facility], please arrive 15 mins early for check-in and test prep. ? ?Please follow these instructions carefully (unless otherwise directed): ? ?Hold all erectile dysfunction medications at least 3 days (72 hrs) prior to test. ? ?On the Night Before the Test: ?Be sure to Drink plenty of water. ?Do not consume any caffeinated/decaffeinated beverages or chocolate 12 hours prior to your test. ?Do not take any antihistamines 12 hours prior to your test. ? ? ?On the Day of the Test: ?Drink plenty of water until 1 hour prior to the test. ?Do not eat any food 4 hours prior to the test. ?You may take your regular medications prior to  the test.  ?Take metoprolol (Lopressor) two hours prior to test. ?HOLD Furosemide/Hydrochlorothiazide morning of the test. ?  ? ?     ?After the Test: ?Drink plenty of water. ?After receiving IV contrast, you may experience a mild flushed feeling. This is normal. ?On occasion, you may experience a mild rash up to 24 hours after the test. This is not dangerous.  If this occurs, you can take Benadryl 25 mg and increase your fluid intake. ?If you experience trouble breathing, this can be serious. If it is severe call 911 IMMEDIATELY. If it is mild, please call our office. ?If you take any of these medications: Glipizide/Metformin, Avandament, Glucavance, please do not take 48 hours after completing test unless otherwise instructed. ? ?We will call to schedule your test 2-4 weeks out understanding that some insurance companies will need an authorization prior to the service being performed.  ? ?For non-scheduling related questions, please contact the cardiac imaging nurse navigator should you have any questions/concerns: ?Marchia Bond, Cardiac Imaging Nurse Navigator ?Gordy Clement, Cardiac Imaging Nurse Navigator ?[facility] ?Direct Office Dial: [phone]  ? ?For scheduling needs, including cancellations and rescheduling, please call [place], [phone]. ? ? ?High-Fiber Eating Plan ?Fiber, also called dietary fiber, is a type of carbohydrate. It is found foods such as fruits, vegetables, whole grains, and beans. A high-fiber diet can have many health benefits. Your health care provider may recommend a high-fiber diet to help: ?Prevent constipation. Fiber can make your bowel movements more regular. ?Lower your cholesterol. ?Relieve the following conditions: ?Inflammation of veins in the anus (hemorrhoids). ?Inflammation of specific areas of the digestive tract (uncomplicated diverticulosis). ?A problem of the large intestine, also called the colon, that sometimes causes pain and diarrhea (irritable bowel syndrome, or IBS). ?Prevent overeating as part of a weight-loss plan. ?Prevent heart disease, type 2 diabetes, and certain cancers. ?What are tips for following this plan? ?Reading food labels ? ?Check the nutrition facts label on food products for the amount of dietary fiber. Choose foods that have 5 grams of fiber or more per serving. ?The  goals for recommended daily fiber intake include: ?Men (age 20 or younger): 34-38 g. ?Men (over age 20): 28-34 g. ?Women (age 75 or younger): 25-28 g. ?Women (over age 67): 22-25 g. ?Your daily fiber goal is _____________ g. ?Shopping ?Choose whole fruits and vegetables instead of processed forms, such as apple juice or applesauce. ?Choose a wide variety of high-fiber foods such as avocados, lentils, oats, and kidney beans. ?Read the nutrition facts label of the foods you choose. Be aware of foods with added fiber. These foods often have high sugar and sodium amounts per serving. ?Cooking ?Use whole-grain flour for baking and cooking. ?Cook with brown rice instead of white rice. ?Meal planning ?Start the day with a breakfast that is high in fiber, such as a cereal that contains 5 g of fiber or more per serving. ?Eat breads and cereals that are made with whole-grain flour instead of refined flour or white flour. ?Eat brown rice, bulgur wheat, or millet instead of white rice. ?Use beans in place of meat in soups, salads, and pasta dishes. ?Be sure that half of the grains you eat each day are whole grains. ?General information ?You can get the recommended daily intake of dietary fiber by: ?Eating a variety of fruits, vegetables, grains, nuts, and beans. ?Taking a fiber supplement if you are not able to take in enough fiber in your diet. It is  better to get fiber through food than from a supplement. ?Gradually increase how much fiber you consume. If you increase your intake of dietary fiber too quickly, you may have bloating, cramping, or gas. ?Drink plenty of water to help you digest fiber. ?Choose high-fiber snacks, such as berries, raw vegetables, nuts, and popcorn. ?What foods should I eat? ?Fruits ?Berries. Pears. Apples. Oranges. Avocado. Prunes and raisins. Dried figs. ?Vegetables ?Sweet potatoes. Spinach. Kale. Artichokes. Cabbage. Broccoli. Cauliflower. Green peas. Carrots. Squash. ?Grains ?Whole-grain breads.  Multigrain cereal. Oats and oatmeal. Brown rice. Barley. Bulgur wheat. [place]. Quinoa. Bran muffins. Popcorn. Rye wafer crackers. ?Meats and other proteins ?[place]y beans, kidney beans, and pinto beans. Soybeans.

## 2022-04-05 NOTE — Progress Notes (Addendum)
?  ?Cardiology Office Note ? ? ?Date:  04/05/2022  ? ?ID:  Jose [name], DOB 1956-04-14, MRN [id number] ? ?PCP:  Lavone Orn, MD  ? ? ?No chief complaint on file. ? ?Chest pain ? ?Wt Readings from Last 3 Encounters:  ?04/05/22 189 lb (85.7 kg)  ?08/03/16 179 lb (81.2 kg)  ?07/05/16 177 lb (80.3 kg)  ?  ? ?  ?History of Present Illness: ?Jose [name] is a 66 y.o. male who is being seen today for the evaluation of chest pain/coronary artery calcification at the request of Lavone Orn, MD.  ? ?Calcium score CT in 2021: ?"CORONARY CALCIUM SCORES: ?  ?Left Main: 0 ?  ?LAD: 169 ?  ?LCx: 0 ?  ?RCA: 1 ?  ?Total Agatston Score: 170 ?  ?MESA database percentile: 30 ?  ?AORTA MEASUREMENTS: ?  ?Ascending Aorta: 34 mm ?  ?Descending Aorta: 24 mm" ? ?Torn rotator cuff surgery on the left shoulder. Hoping to hold off until March 2024.  He had chest pain and left arm pain that prompted this appt, but he thinks it was the rotator cuff problem.  ? ?Statin was started but he had perceived  side effects of palpitations.  Now looking in to PCSK9 inhibitor. ? ?Denies : Chest pain. Dizziness. Leg edema. Nitroglycerin use. Orthopnea. Palpitations. Paroxysmal nocturnal dyspnea. Shortness of breath. Syncope.   ? ?Brother with fatal stroke.  ? ?Denies : Dizziness. Leg edema. Nitroglycerin use. Orthopnea. Palpitations. Paroxysmal nocturnal dyspnea. Shortness of breath. Syncope.   ? ? ?Past Medical History:  ?Diagnosis Date  ? Abnormal ECG 11/04/2013  ? Q waves noted in 3, aVF, poor R wave progression  ? Acne rosacea   ? Anginal pain ([place])   ? relating to side pain  ? Arthritis   ? BMI 30.0-30.9,adult   ? Chronic left mastoiditis   ? Fluid behid left mastoid  ? Complication of anesthesia   ? R sided chest pain as complication from a nerve block from shoulder surgery in 2012, had to go to ED. CXR showed R hemidiaphragm elevation and atelectasis.   ? Diverticulitis   ? Diverticulosis   ? Dizziness 11/04/2013  ? ENT eval reassuring.   ?  DJD (degenerative joint disease)   ? DM (diabetes mellitus) ([place])   ? Elevated coronary artery calcium score   ? Elevated LFTs   ? chronic  ? Fatty liver   ? GERD (gastroesophageal reflux disease)   ? hx none now since lost wt  ? History of kidney stones   ? Hypercholesterolemia   ? Hypertension   ? IBS (irritable bowel syndrome)   ? Lumbar facet arthropathy   ? Obesity   ? Old cerebrovascular accident (CVA) without late effect   ? Palpitations 11/04/2013  ? PONV (postoperative nausea and vomiting)   ? Restless legs   ? Sleep apnea   ? lost a lot of weight does not wear CPAP anymore  ? Stroke [facility])   ? tia on mri  ? TIA (transient ischemic attack)   ? Ulnar neuropathy   ? ? ?Past Surgical History:  ?Procedure Laterality Date  ? COLONOSCOPY    ? EUS N/A 01/11/2013  ? Procedure: ESOPHAGEAL ENDOSCOPIC ULTRASOUND (EUS) RADIAL;  Surgeon: Arta Silence, MD;  Location: [facility];  Service: Endoscopy;  Laterality: N/A;  radial scope  will bring h&p  ? EYE SURGERY    ? retinal repair possibly L eye  ? kid stones    ? cysto  ?  LITHOTRIPSY    ? unable to complete this procedure  ? MULTIPLE TOOTH EXTRACTIONS    ? PARATHYROIDECTOMY Right 08/26/2015  ? RADIOLOGY WITH ANESTHESIA N/A 08/04/2016  ? Procedure: MRI OF THE BRAIN WITH AND WITHOUT;  Surgeon: Medication Radiologist, MD;  Location: [place];  Service: Radiology;  Laterality: N/A;  ? SHOULDER ARTHROSCOPY  10/18/2011  ? Procedure: ARTHROSCOPY SHOULDER;  Surgeon: Hessie Dibble, MD;  Location: [place];  Service: Orthopedics;  Laterality: Right;  right shoulder arthroscopy , acromioplasty, partial DCR and rotator cuff repair  ? THYROIDECTOMY Right 08/26/2015  ? Procedure: RIGHT PARATHYROID REMOVAL;  Surgeon: Leta Baptist, MD;  Location: [place];  Service: ENT;  Laterality: Right;  ? ? ? ?Current Outpatient Medications  ?Medication Sig Dispense Refill  ? amLODipine-valsartan (EXFORGE) 5-160 MG tablet Take 1 tablet by mouth once daily  11  ? aspirin EC 81 MG tablet  Take 81 mg by mouth daily.    ? Cholecalciferol (VITAMIN D-3) 1000 UNITS CAPS Take 1,000 Units by mouth daily.     ? clobetasol cream (TEMOVATE) 7.51 % Apply 1 application topically daily.    ? Clobetasol Propionate (TEMOVATE) 0.05 % external spray Apply 1 spray topically as needed (for Psoriasis).    ? Cyanocobalamin (VITAMIN B12) 1000 MCG TBCR Take 1 tablet by mouth 3 (three) times a week.    ? cyclobenzaprine (FLEXERIL) 10 MG tablet Take 1 tablet (10 mg total) by mouth at bedtime as needed for muscle spasms. 20 tablet 0  ? diclofenac (VOLTAREN) 75 MG EC tablet Take 1 tablet (75 mg total) by mouth 2 (two) times daily. 50 tablet 2  ? ibuprofen (ADVIL,MOTRIN) 200 MG tablet Take 400 mg by mouth every 6 (six) hours as needed for headache or moderate pain.    ? pantoprazole (PROTONIX) 40 MG tablet Take 1 tablet by mouth every other day.    ? ?No current facility-administered medications for this visit.  ? ? ?Allergies:   Pravastatin, Rosuvastatin, Lisinopril, Penicillins, and Sulfa antibiotics  ? ? ?Social History:  The patient  reports that he has never smoked. He has never used smokeless tobacco. He reports current alcohol use. He reports that he does not use drugs.  ? ?Family History:  The patient's family history includes CVA in his brother; Coronary artery disease in his father; Diabetes in his brother and father; Gastric cancer in his father; Heart attack in his father; Other in his mother; Thyroid nodules in his sister and sister.  ? ? ?ROS:  Please see the history of present illness.   Otherwise, review of systems are positive for not enough exercise.   All other systems are reviewed and negative.  ? ? ?PHYSICAL EXAM: ?VS:  BP (!) 144/96   Pulse 80   Ht '5\' 4"'$  (1.626 m)   Wt 189 lb (85.7 kg)   SpO2 98%   BMI 32.44 kg/m?  , BMI Body mass index is 32.44 kg/m?. ?GEN: Well nourished, well developed, in no acute distress ?HEENT: normal ?Neck: no JVD, carotid bruits, or masses ?Cardiac: RRR; no murmurs, rubs, or  gallops,no edema , 2+ PT pulses ?Respiratory:  clear to auscultation bilaterally, normal work of breathing ?GI: soft, nontender, nondistended, + BS ?MS: no deformity or atrophy ?Skin: warm and dry, no rash ?Neuro:  Strength and sensation are intact ?Psych: euthymic mood, full affect ? ? ?EKG:   ?The ekg ordered from March 2023 demonstrates NSR ? ? ?Recent Labs: ?No results found for requested labs  within last 8760 hours.  ? ?Lipid Panel ?No results found for: CHOL, TRIG, HDL, CHOLHDL, VLDL, LDLCALC, LDLDIRECT ?  ?Other studies Reviewed: ?Additional studies/ records that were reviewed today with results demonstrating: normal renal finction in 11/2021.  A1C 5.6.  . ? ? ?ASSESSMENT AND PLAN: ? ?Chest pain/arm pain/ coronary artery calcification: Plan for CTA coronaries to eval for obstructive CAD. Shoulder surgery coming up at some point. Metoprolol 100 mg prior to the CTA.   ?Family h/o MI: Father with MI in his 68s.  ?HTN: Low salt diet; whole food, plant based diet recommended. COntinue taking meds.  ?Statin intolerant: Statin indicated for coronary calcium, but he had palpitations.  Considering PCSK9 inhibitor now.  LDL 150 in 11/2021.  Would definitely benefit from lower LDL.  ? ? ?Current medicines are reviewed at length with the patient today.  The patient concerns regarding his medicines were addressed. ? ?The following changes have been made:  No change ? ?Labs/ tests ordered today include: BMet for CTA  ?No orders of the defined types were placed in this encounter. ? ? ?Recommend 150 minutes/week of aerobic exercise ?Low fat, low carb, high fiber diet recommended ? ?Disposition:   FU in for CTA; then 1 year ? ? ?Signed, ?Larae Grooms, MD  ?04/05/2022 9:26 AM    ?[facility] ?[address], [place], [place]  [address] ?Phone: [phone]; Fax: [phone]  ? ?

## 2022-04-21 ENCOUNTER — Telehealth (HOSPITAL_COMMUNITY): Payer: Self-pay | Admitting: Emergency Medicine

## 2022-04-21 NOTE — Telephone Encounter (Signed)
Wife states they got a phone call from [facility] regarding balance of $800+ for CCTA. Pt states he cannot afford this and wants to cancel appt.   Wife reports that he has plans to repair rotator cuff and hopes to meet deductible at that time.   Patient will call back when ready to schedule   Marchia Bond RN Navigator Cardiac [facility] [phone] Office  [phone] Cell

## 2022-04-21 NOTE — Telephone Encounter (Signed)
Reaching out to patient to offer assistance regarding upcoming cardiac imaging study; pt verbalizes understanding of appt date/time, parking situation and where to check in, pre-test NPO status and medications ordered, and verified current allergies; name and call back number provided for further questions should they arise Marchia Bond RN Navigator Cardiac [facility] [phone] office [phone] cell   Nervous! Arrival 830 '100mg'$  metoprolol tartrate

## 2022-04-22 ENCOUNTER — Ambulatory Visit (HOSPITAL_COMMUNITY): Payer: BC Managed Care – PPO

## 2022-07-01 ENCOUNTER — Encounter (HOSPITAL_COMMUNITY): Payer: Self-pay

## 2022-09-19 ENCOUNTER — Other Ambulatory Visit: Payer: Self-pay | Admitting: Internal Medicine

## 2022-09-19 DIAGNOSIS — K760 Fatty (change of) liver, not elsewhere classified: Secondary | ICD-10-CM

## 2022-09-23 ENCOUNTER — Ambulatory Visit
Admission: RE | Admit: 2022-09-23 | Discharge: 2022-09-23 | Disposition: A | Discharge status: Home / Self Care | Payer: BC Managed Care – PPO | Source: Ambulatory Visit | Attending: Internal Medicine | Admitting: Internal Medicine

## 2022-09-23 DIAGNOSIS — K760 Fatty (change of) liver, not elsewhere classified: Secondary | ICD-10-CM

## 2023-06-07 DIAGNOSIS — K529 Noninfective gastroenteritis and colitis, unspecified: Secondary | ICD-10-CM | POA: Diagnosis not present

## 2023-06-07 DIAGNOSIS — Z4789 Encounter for other orthopedic aftercare: Secondary | ICD-10-CM | POA: Diagnosis not present

## 2023-06-07 DIAGNOSIS — L4050 Arthropathic psoriasis, unspecified: Secondary | ICD-10-CM | POA: Diagnosis not present

## 2023-06-07 DIAGNOSIS — E119 Type 2 diabetes mellitus without complications: Secondary | ICD-10-CM | POA: Diagnosis not present

## 2023-06-07 DIAGNOSIS — S43422D Sprain of left rotator cuff capsule, subsequent encounter: Secondary | ICD-10-CM | POA: Diagnosis not present

## 2023-06-07 DIAGNOSIS — I700 Atherosclerosis of aorta: Secondary | ICD-10-CM | POA: Diagnosis not present

## 2023-06-07 DIAGNOSIS — M25512 Pain in left shoulder: Secondary | ICD-10-CM | POA: Diagnosis not present

## 2023-06-23 DIAGNOSIS — L4050 Arthropathic psoriasis, unspecified: Secondary | ICD-10-CM | POA: Diagnosis not present

## 2023-06-23 DIAGNOSIS — R102 Pelvic and perineal pain: Secondary | ICD-10-CM | POA: Diagnosis not present

## 2023-06-23 DIAGNOSIS — I700 Atherosclerosis of aorta: Secondary | ICD-10-CM | POA: Diagnosis not present

## 2023-06-27 DIAGNOSIS — M79604 Pain in right leg: Secondary | ICD-10-CM | POA: Diagnosis not present

## 2023-07-03 DIAGNOSIS — M6281 Muscle weakness (generalized): Secondary | ICD-10-CM | POA: Diagnosis not present

## 2023-07-03 DIAGNOSIS — S76311D Strain of muscle, fascia and tendon of the posterior muscle group at thigh level, right thigh, subsequent encounter: Secondary | ICD-10-CM | POA: Diagnosis not present

## 2023-07-03 DIAGNOSIS — S86911D Strain of unspecified muscle(s) and tendon(s) at lower leg level, right leg, subsequent encounter: Secondary | ICD-10-CM | POA: Diagnosis not present

## 2023-07-11 ENCOUNTER — Emergency Department (HOSPITAL_COMMUNITY)
Admission: EM | Admit: 2023-07-11 | Discharge: 2023-07-12 | Disposition: A | Discharge status: Home / Self Care | Payer: Medicare PPO | Attending: Emergency Medicine | Admitting: Emergency Medicine

## 2023-07-11 ENCOUNTER — Other Ambulatory Visit: Payer: Self-pay

## 2023-07-11 ENCOUNTER — Encounter (HOSPITAL_COMMUNITY): Payer: Self-pay | Admitting: Emergency Medicine

## 2023-07-11 DIAGNOSIS — N200 Calculus of kidney: Secondary | ICD-10-CM | POA: Diagnosis not present

## 2023-07-11 DIAGNOSIS — K5730 Diverticulosis of large intestine without perforation or abscess without bleeding: Secondary | ICD-10-CM | POA: Diagnosis not present

## 2023-07-11 DIAGNOSIS — N132 Hydronephrosis with renal and ureteral calculous obstruction: Secondary | ICD-10-CM | POA: Diagnosis not present

## 2023-07-11 DIAGNOSIS — R1011 Right upper quadrant pain: Secondary | ICD-10-CM | POA: Diagnosis not present

## 2023-07-11 DIAGNOSIS — K760 Fatty (change of) liver, not elsewhere classified: Secondary | ICD-10-CM | POA: Diagnosis not present

## 2023-07-11 DIAGNOSIS — N134 Hydroureter: Secondary | ICD-10-CM | POA: Diagnosis not present

## 2023-07-11 DIAGNOSIS — N400 Enlarged prostate without lower urinary tract symptoms: Secondary | ICD-10-CM | POA: Diagnosis not present

## 2023-07-11 LAB — CBC
HCT: 44.1 % (ref 39.0–52.0)
Hemoglobin: 14.9 g/dL (ref 13.0–17.0)
MCH: 30.3 pg (ref 26.0–34.0)
MCHC: 33.8 g/dL (ref 30.0–36.0)
MCV: 89.8 fL (ref 80.0–100.0)
Platelets: 167 10*3/uL (ref 150–400)
RBC: 4.91 MIL/uL (ref 4.22–5.81)
RDW: 12.9 % (ref 11.5–15.5)
WBC: 7.8 10*3/uL (ref 4.0–10.5)
nRBC: 0 % (ref 0.0–0.2)

## 2023-07-11 MED ORDER — OXYCODONE-ACETAMINOPHEN 5-325 MG PO TABS
1.0000 | ORAL_TABLET | ORAL | Status: DC | PRN
  Administered 2023-07-11: 1 via ORAL
  Filled 2023-07-11: qty 1

## 2023-07-11 MED ORDER — ONDANSETRON 4 MG PO TBDP
4.0000 mg | ORAL_TABLET | Freq: Once | ORAL | Status: AC | PRN
  Administered 2023-07-11: 4 mg via ORAL
  Filled 2023-07-11: qty 1

## 2023-07-11 NOTE — ED Triage Notes (Signed)
Pt with RUQ pain and nausea, some diarrhea, worse after fatty meals.  Pt states it has been happening intermittently but is significantly worse today.

## 2023-07-12 ENCOUNTER — Emergency Department (HOSPITAL_COMMUNITY): Payer: Medicare PPO

## 2023-07-12 DIAGNOSIS — N132 Hydronephrosis with renal and ureteral calculous obstruction: Secondary | ICD-10-CM | POA: Diagnosis not present

## 2023-07-12 DIAGNOSIS — N134 Hydroureter: Secondary | ICD-10-CM | POA: Diagnosis not present

## 2023-07-12 DIAGNOSIS — N400 Enlarged prostate without lower urinary tract symptoms: Secondary | ICD-10-CM | POA: Diagnosis not present

## 2023-07-12 DIAGNOSIS — R1011 Right upper quadrant pain: Secondary | ICD-10-CM | POA: Diagnosis not present

## 2023-07-12 DIAGNOSIS — K5730 Diverticulosis of large intestine without perforation or abscess without bleeding: Secondary | ICD-10-CM | POA: Diagnosis not present

## 2023-07-12 DIAGNOSIS — K760 Fatty (change of) liver, not elsewhere classified: Secondary | ICD-10-CM | POA: Diagnosis not present

## 2023-07-12 MED ORDER — LACTATED RINGERS IV BOLUS
1000.0000 mL | Freq: Once | INTRAVENOUS | Status: AC
  Administered 2023-07-12: 1000 mL via INTRAVENOUS

## 2023-07-12 MED ORDER — ONDANSETRON HCL 4 MG/2ML IJ SOLN
4.0000 mg | Freq: Once | INTRAMUSCULAR | Status: AC | PRN
  Administered 2023-07-12: 4 mg via INTRAVENOUS
  Filled 2023-07-12: qty 2

## 2023-07-12 MED ORDER — OXYCODONE-ACETAMINOPHEN 5-325 MG PO TABS: 1.0000 | ORAL_TABLET | Freq: Four times a day (QID) | ORAL | 0 refills | Status: DC | PRN

## 2023-07-12 MED ORDER — ONDANSETRON 4 MG PO TBDP: 4.0000 mg | ORAL_TABLET | Freq: Three times a day (TID) | ORAL | 0 refills | Status: DC | PRN

## 2023-07-12 MED ORDER — HYDROMORPHONE HCL 1 MG/ML IJ SOLN
0.5000 mg | Freq: Once | INTRAMUSCULAR | Status: AC | PRN
  Administered 2023-07-12: 0.5 mg via INTRAVENOUS
  Filled 2023-07-12: qty 1

## 2023-07-12 MED ORDER — TAMSULOSIN HCL 0.4 MG PO CAPS: 0.4000 mg | ORAL_CAPSULE | Freq: Two times a day (BID) | ORAL | 0 refills | Status: DC

## 2023-07-12 NOTE — ED Notes (Signed)
US completed

## 2023-07-12 NOTE — Discharge Instructions (Signed)
You need to follow-up with the urologist.  Let them know you had a 1-2 mm kidney stone and that your creatinine was slightly elevated at 1.69 and we recommended you follow-up for recheck of kidney function and to ensure passage of the stone.  If your symptoms change or worsen, please return to the ER.

## 2023-07-12 NOTE — ED Provider Notes (Signed)
[facility] Emergency Department Provider Note MRN:  [id number]  Arrival date & time: 07/12/23     Chief Complaint   Abdominal Pain   History of Present Illness   Jose [name] is a 67 y.o. year-old male presents to the ED with chief complaint of right upper quadrant pain and right flank pain.  He reports associated nausea.  Also reports having some diarrhea.  States that it is worse after eating a fatty meal.  He denies fevers or chills.  States that he has had prior history of gallstones.  Also reports prior history of kidney stones.  He denies any successful treatments prior to arrival.  History provided by patient.   Review of Systems  Pertinent positive and negative review of systems noted in HPI.    Physical Exam   Vitals:   07/12/23 0200 07/12/23 0215  BP: (!) 141/85 (!) 141/92  Pulse: 61 68  Resp:    Temp:    SpO2: 98% 94%    CONSTITUTIONAL:  well-appearing, NAD NEURO:  Alert and oriented x 3, CN 3-12 grossly intact EYES:  eyes equal and reactive ENT/NECK:  Supple, no stridor  CARDIO:  normal rate, regular rhythm, appears well-perfused  PULM:  No respiratory distress, CTAB GI/GU:  non-distended, no focal abdominal tenderness MSK/SPINE:  No gross deformities, no edema, moves all extremities  SKIN:  no rash, atraumatic   *Additional and/or pertinent findings included in MDM below  Diagnostic and Interventional Summary    EKG Interpretation Date/Time:    Ventricular Rate:    PR Interval:    QRS Duration:    QT Interval:    QTC Calculation:   R Axis:      Text Interpretation:         Labs Reviewed  COMPREHENSIVE METABOLIC PANEL - Abnormal; Notable for the following components:      Result Value   Glucose, Bld 204 (*)    Creatinine, Ser 1.69 (*)    AST 82 (*)    ALT 124 (*)    Total Bilirubin 2.0 (*)    GFR, Estimated 44 (*)    All other components within normal limits  URINALYSIS, ROUTINE W REFLEX MICROSCOPIC -  Abnormal; Notable for the following components:   Glucose, UA 50 (*)    Hgb urine dipstick LARGE (*)    All other components within normal limits  LIPASE, BLOOD  CBC    CT Renal Stone Study  Final Result    US Abdomen Limited RUQ (LIVER/GB)  Final Result      Medications  oxyCODONE-acetaminophen (PERCOCET/ROXICET) 5-325 MG per tablet 1 tablet (1 tablet Oral Given 07/11/23 2336)  ondansetron (ZOFRAN-ODT) disintegrating tablet 4 mg (4 mg Oral Given 07/11/23 2337)  HYDROmorphone (DILAUDID) injection 0.5 mg (0.5 mg Intravenous Given 07/12/23 0113)  ondansetron (ZOFRAN) injection 4 mg (4 mg Intravenous Given 07/12/23 0113)  lactated ringers bolus 1,000 mL (0 mLs Intravenous Stopped 07/12/23 4132)     Procedures  /  Critical Care Procedures  ED Course and Medical Decision Making  I have reviewed the triage vital signs, the nursing notes, and pertinent available records from the EMR.  Social Determinants Affecting Complexity of Care: Patient has no clinically significant social determinants affecting this chief complaint..   ED Course:    Medical Decision Making Patient here with right sided abdominal/flank pain.  Reports history of gallstones.  He is concerned about his gallbladder.  Will check ultrasound.  He does have mildly elevated LFTs  and T. bili is 2.0.  Ultrasound shows no gallstones.  Urinalysis is notable for RBCs and hemoglobin, will check CT renal to rule out kidney stone.  CT renal shows a 1 to 2 mm stone.  His creatinine is 1.69.  I discussed the case with Dr. Adela Lank, who agrees with discharge and outpatient urology follow-up.  Patient will need to have his creatinine rechecked by the urologist.  His pain is fairly well-controlled.  Will discharge home with Percocet, Flomax, and Zofran.  Patient understands and agrees with the plan.  Amount and/or Complexity of Data Reviewed Labs: ordered.    Details: As above Radiology: ordered and independent interpretation performed.     Details: No gallstones see on [place]  Risk Prescription drug management.         Consultants: No consultations were needed in caring for this patient.   Treatment and Plan: I considered admission due to patient's initial presentation, but after considering the examination and diagnostic results, patient will not require admission and can be discharged with outpatient follow-up.    Final Clinical Impressions(s) / ED Diagnoses     ICD-10-CM   1. Kidney stone  N20.0       ED Discharge Orders          Ordered    oxyCODONE-acetaminophen (PERCOCET) 5-325 MG tablet  Every 6 hours PRN        07/12/23 0324    ondansetron (ZOFRAN-ODT) 4 MG disintegrating tablet  Every 8 hours PRN        07/12/23 0324    tamsulosin (FLOMAX) 0.4 MG CAPS capsule  2 times daily        07/12/23 0324              Discharge Instructions Discussed with and Provided to Patient:    Discharge Instructions      You need to follow-up with the urologist.  Let them know you had a 1-2 mm kidney stone and that your creatinine was slightly elevated at 1.69 and we recommended you follow-up for recheck of kidney function and to ensure passage of the stone.  If your symptoms change or worsen, please return to the ER.      Roxy Horseman, PA-C 07/12/23 0340    Melene Plan, DO 07/12/23 [phone]

## 2023-07-12 NOTE — ED Notes (Signed)
US at bedside

## 2023-07-12 NOTE — ED Notes (Signed)
Kidney stone strainer provided to patient as well as instructions on how to use it.

## 2023-07-12 NOTE — ED Notes (Signed)
Patient here c/o RUQ pain after eating high fat meal. Patient has been told he has gallbladder issues and feels this to be the source of his pain but states it is much worse then ever before. Patient a/o x 4 respirations even and non labored bowel sounds audible in all quads tender to palpation in ruq reports intermittent nausea

## 2023-07-13 DIAGNOSIS — S86911D Strain of unspecified muscle(s) and tendon(s) at lower leg level, right leg, subsequent encounter: Secondary | ICD-10-CM | POA: Diagnosis not present

## 2023-07-13 DIAGNOSIS — M6281 Muscle weakness (generalized): Secondary | ICD-10-CM | POA: Diagnosis not present

## 2023-07-13 DIAGNOSIS — S76311D Strain of muscle, fascia and tendon of the posterior muscle group at thigh level, right thigh, subsequent encounter: Secondary | ICD-10-CM | POA: Diagnosis not present

## 2023-07-17 DIAGNOSIS — S76311D Strain of muscle, fascia and tendon of the posterior muscle group at thigh level, right thigh, subsequent encounter: Secondary | ICD-10-CM | POA: Diagnosis not present

## 2023-07-17 DIAGNOSIS — M6281 Muscle weakness (generalized): Secondary | ICD-10-CM | POA: Diagnosis not present

## 2023-07-17 DIAGNOSIS — S86911D Strain of unspecified muscle(s) and tendon(s) at lower leg level, right leg, subsequent encounter: Secondary | ICD-10-CM | POA: Diagnosis not present

## 2023-07-19 DIAGNOSIS — N201 Calculus of ureter: Secondary | ICD-10-CM | POA: Diagnosis not present

## 2023-07-25 DIAGNOSIS — E1159 Type 2 diabetes mellitus with other circulatory complications: Secondary | ICD-10-CM | POA: Diagnosis not present

## 2023-07-25 DIAGNOSIS — E7800 Pure hypercholesterolemia, unspecified: Secondary | ICD-10-CM | POA: Diagnosis not present

## 2023-07-25 DIAGNOSIS — N50812 Left testicular pain: Secondary | ICD-10-CM | POA: Diagnosis not present

## 2023-07-25 DIAGNOSIS — M79604 Pain in right leg: Secondary | ICD-10-CM | POA: Diagnosis not present

## 2023-07-25 DIAGNOSIS — I10 Essential (primary) hypertension: Secondary | ICD-10-CM | POA: Diagnosis not present

## 2023-07-28 DIAGNOSIS — D485 Neoplasm of uncertain behavior of skin: Secondary | ICD-10-CM | POA: Diagnosis not present

## 2023-07-28 DIAGNOSIS — L570 Actinic keratosis: Secondary | ICD-10-CM | POA: Diagnosis not present

## 2023-08-21 DIAGNOSIS — R1032 Left lower quadrant pain: Secondary | ICD-10-CM | POA: Diagnosis not present

## 2023-08-29 DIAGNOSIS — Z1211 Encounter for screening for malignant neoplasm of colon: Secondary | ICD-10-CM | POA: Diagnosis not present

## 2023-08-29 DIAGNOSIS — K4090 Unilateral inguinal hernia, without obstruction or gangrene, not specified as recurrent: Secondary | ICD-10-CM | POA: Diagnosis not present

## 2023-08-29 DIAGNOSIS — R6881 Early satiety: Secondary | ICD-10-CM | POA: Diagnosis not present

## 2023-08-31 DIAGNOSIS — R140 Abdominal distension (gaseous): Secondary | ICD-10-CM | POA: Diagnosis not present

## 2023-08-31 DIAGNOSIS — R1013 Epigastric pain: Secondary | ICD-10-CM | POA: Diagnosis not present

## 2023-08-31 DIAGNOSIS — R6881 Early satiety: Secondary | ICD-10-CM | POA: Diagnosis not present

## 2023-08-31 DIAGNOSIS — K2930 Chronic superficial gastritis without bleeding: Secondary | ICD-10-CM | POA: Diagnosis not present

## 2023-08-31 DIAGNOSIS — R110 Nausea: Secondary | ICD-10-CM | POA: Diagnosis not present

## 2023-09-08 DIAGNOSIS — K2930 Chronic superficial gastritis without bleeding: Secondary | ICD-10-CM | POA: Diagnosis not present

## 2023-09-15 ENCOUNTER — Ambulatory Visit: Payer: Self-pay | Admitting: Surgery

## 2023-09-15 ENCOUNTER — Telehealth: Payer: Self-pay | Admitting: *Deleted

## 2023-09-15 DIAGNOSIS — K4020 Bilateral inguinal hernia, without obstruction or gangrene, not specified as recurrent: Secondary | ICD-10-CM | POA: Diagnosis not present

## 2023-09-15 NOTE — H&P (Signed)
Subjective    Chief Complaint: New Consultation       History of Present Illness: Jose [name] is a 67 y.o. male who is seen today as an office consultation at the request of Dr. Dulce Sellar for evaluation of New Consultation .   This is a 67 year old male who presents with a 15-year history of intermittent discomfort in his left groin.  Recently, this has become more noticeable.  Patient recently had a issue with a right kidney stone.  He had a CT scan performed without contrast.  This noted the presence of bilateral fat-containing inguinal hernias.  He is referred to [place] to discuss surgical repair.  The patient recently underwent upper endoscopy by Dr. Dulce Sellar for epigastric symptoms.  He is negative for gallstones.     Review of Systems: A complete review of systems was obtained from the patient.  I have reviewed this information and discussed as appropriate with the patient.  See HPI as well for other ROS.   Review of Systems  Constitutional: Negative.   HENT: Negative.    Eyes: Negative.   Respiratory: Negative.    Cardiovascular: Negative.   Gastrointestinal:  Positive for abdominal pain.  Genitourinary: Negative.   Musculoskeletal: Negative.   Skin: Negative.   Neurological: Negative.   Endo/Heme/Allergies: Negative.   Psychiatric/Behavioral: Negative.          Medical History: Past Medical History      Past Medical History:  Diagnosis Date   Abnormal ECG     Arthritis     CVA (cerebral vascular accident) (CMS/HHS-HCC)     Diverticulitis     DM (diabetes mellitus) (CMS/HHS-HCC)     Elevated LFTs     Fatty liver     GERD (gastroesophageal reflux disease)     Hypercholesteremia     Hypertension     IBS (irritable bowel syndrome)     Obesity     Restless leg     Sleep apnea      Does not wear CPAP   TIA (transient ischemic attack)          Problem List     Patient Active Problem List  Diagnosis   Abnormal ECG   Diabetes mellitus (CMS/HHS-HCC)   Obesity,  unspecified   Palpitations   S/P parathyroidectomy        Past Surgical History       Past Surgical History:  Procedure Laterality Date   ARTHROSCOPIC ROTATOR CUFF REPAIR Bilateral 2012    2024   PARATHYROIDECTOMY   08/26/2015   THYROIDECTOMY TOTAL   08/26/2015        Allergies       Allergies  Allergen Reactions   Pravastatin Other (See Comments)   Rosuvastatin Other (See Comments)   Lisinopril Other (See Comments)      dehydration   Penicillins Rash   Sulfa (Sulfonamide Antibiotics) Nausea      nausea        Medications Ordered Prior to Encounter        Current Outpatient Medications on File Prior to Visit  Medication Sig Dispense Refill   amLODIPine-valsartan (EXFORGE) 5-320 mg per tablet Take 1 tablet by mouth once daily       aspirin 81 MG EC tablet Take 81 mg by mouth once daily       cholecalciferol (VITAMIN D3) 1000 unit capsule Take 1,000 Units by mouth once daily       hydroCHLOROthiazide (HYDRODIURIL) 25 MG tablet take 1 tablet by mouth  every day in the morning for 30 days       pantoprazole (PROTONIX) 40 MG DR tablet TAKE 1 TABLET BY MOUTH EVERY OTHER DAY FOR 90 DAYS        No current facility-administered medications on file prior to visit.        Family History       Family History  Problem Relation Age of Onset   Brain cancer Mother     Diabetes Father     Myocardial Infarction (Heart attack) Father     Coronary Artery Disease (Blocked arteries around heart) Father     Diabetes Maternal Grandmother     Stroke Maternal Grandmother          Tobacco Use History  Social History       Tobacco Use  Smoking Status Never  Smokeless Tobacco Never        Social History  Social History        Socioeconomic History   Marital status: Married  Tobacco Use   Smoking status: Never   Smokeless tobacco: Never  Substance and Sexual Activity   Alcohol use: Never   Drug use: Never    Social Determinants of Health      Received from [employer]    Social Network        Objective:         Vitals:    09/15/23 0947  BP: (!) 140/90  Pulse: 83  Temp: 37.1 C (98.8 F)  SpO2: 99%  Weight: 87.5 kg (192 lb 12.8 oz)  Height: 162.6 cm (5\' 4" )  PainSc: 1   PainLoc: Abdomen    Body mass index is 33.09 kg/m.   Physical Exam    Constitutional:  WDWN in NAD, conversant, no obvious deformities; lying in bed comfortably Eyes:  Pupils equal, round; sclera anicteric; moist conjunctiva; no lid lag HENT:  Oral mucosa moist; good dentition  Neck:  No masses palpated, trachea midline; no thyromegaly Lungs:  CTA bilaterally; normal respiratory effort CV:  Regular rate and rhythm; no murmurs; extremities well-perfused with no edema Abd:  +bowel sounds, soft, non-tender, no palpable organomegaly; no palpable hernias GU: Bilateral descended testes, no testicular masses, palpable reducible bilateral inguinal hernias Musc: Normal gait; no apparent clubbing or cyanosis in extremities Lymphatic:  No palpable cervical or axillary lymphadenopathy Skin:  Warm, dry; no sign of jaundice Psychiatric - alert and oriented x 4; calm mood and affect     Labs, Imaging and Diagnostic Testing: CLINICAL DATA:  Right upper quadrant pain.   EXAM: CT ABDOMEN AND PELVIS WITHOUT CONTRAST   TECHNIQUE: Multidetector CT imaging of the abdomen and pelvis was performed following the standard protocol without IV contrast.   RADIATION DOSE REDUCTION: This exam was performed according to the departmental dose-optimization program which includes automated exposure control, adjustment of the mA and/or kV according to patient size and/or use of iterative reconstruction technique.   COMPARISON:  August 16, 2012   FINDINGS: Lower chest: No acute abnormality.   Hepatobiliary: No focal liver abnormality is seen. No gallstones, gallbladder wall thickening, or biliary dilatation.   Pancreas: Unremarkable. No pancreatic ductal dilatation  or surrounding inflammatory changes.   Spleen: Normal in size without focal abnormality.   Adrenals/Urinary Tract: Adrenal glands are unremarkable. Kidneys are normal in size, without focal lesions. A 1 mm to 2 mm obstructing renal calculus is seen within the distal right ureter (axial CT image 72, CT series 3), with mild right-sided hydronephrosis and  hydroureter. Bladder is unremarkable.   Stomach/Bowel: Stomach is within normal limits. Appendix appears normal. No evidence of bowel wall thickening, distention, or inflammatory changes. Noninflamed diverticula are seen throughout the descending and sigmoid colon.   Vascular/Lymphatic: Aortic atherosclerosis. No enlarged abdominal or pelvic lymph nodes.   Reproductive: There is moderate severity prostate gland enlargement.   Other: Stable, bilateral, proximally 3.1 cm diameter fat containing inguinal hernias are seen.   No abdominopelvic ascites.   Musculoskeletal: No acute or significant osseous findings.   IMPRESSION: 1. 1 mm to 2 mm obstructing renal calculus within the distal right ureter. 2. Colonic diverticulosis. 3. Moderate severity prostate gland enlargement. 4. Stable, bilateral, fat containing inguinal hernias. 5. Aortic atherosclerosis.   Aortic Atherosclerosis (ICD10-I70.0).     Electronically Signed   By: Aram Candela M.D.   On: 07/12/2023 03:03   Assessment and Plan:  Diagnoses and all orders for this visit:   Non-recurrent bilateral inguinal hernia without obstruction or gangrene     Recommend open repair of bilateral inguinal hernias with mesh.The surgical procedure has been discussed with the patient.  Potential risks, benefits, alternative treatments, and expected outcomes have been explained.  All of the patient's questions at this time have been answered.  The likelihood of reaching the patient's treatment goal is good.  The patient understands the proposed surgical procedure and wishes to  proceed.     Lissa Morales, MD  09/15/2023 11:36 AM

## 2023-09-15 NOTE — Telephone Encounter (Signed)
   Pre-operative Risk Assessment    Patient Name: Jose [name]  DOB: 1956/06/21 MRN: [id number]    DATE OF LAST VISIT: 04/05/22 DR. VARANASI  DATE OF NEXT VISIT: NONE  Request for Surgical Clearance    Procedure:   B/L INGUINAL HERNIA REPAIR  Date of Surgery:  Clearance TBD                                 Surgeon:  DR. MATTHEW TSUEI  Surgeon's Group or Practice Name:  [facility] Phone number:  [phone] Fax number:  [phone] ATTN: Michel Bickers, LPN   Type of Clearance Requested:   - Medical  - Pharmacy:  [facility]     Type of Anesthesia:  General    Additional requests/questions:    Elpidio Anis   09/15/2023, 4:06 PM

## 2023-09-18 NOTE — Telephone Encounter (Signed)
Pt has appt 09/26/23 with Tereso Newcomer, PAC . I will update all parties involved.

## 2023-09-18 NOTE — Telephone Encounter (Signed)
Primary Cardiologist:Jose Eldridge Dace, MD  Chart reviewed as part of pre-operative protocol coverage. Because of Jose [name]'s past medical history and time since last visit, he/she will require a follow-up visit in order to better assess preoperative cardiovascular risk.  Pre-op covering staff: - Please schedule appointment and call patient to inform them. - Please contact requesting surgeon's office via preferred method (i.e, phone, fax) to inform them of need for appointment prior to surgery.    Jose Aland, NP-C 09/18/2023, 9:36 AM [address] Office [phone] Fax [phone]

## 2023-09-18 NOTE — Telephone Encounter (Signed)
Left message to call back to schedule in office appt for pre op clearance.

## 2023-09-26 ENCOUNTER — Encounter: Payer: Self-pay | Admitting: Physician Assistant

## 2023-09-26 ENCOUNTER — Ambulatory Visit: Payer: Medicare PPO | Attending: Physician Assistant | Admitting: Physician Assistant

## 2023-09-26 VITALS — BP 103/72 | HR 74 | Ht 64.0 in | Wt 189.9 lb

## 2023-09-26 DIAGNOSIS — I2510 Atherosclerotic heart disease of native coronary artery without angina pectoris: Secondary | ICD-10-CM | POA: Diagnosis not present

## 2023-09-26 DIAGNOSIS — Z01810 Encounter for preprocedural cardiovascular examination: Secondary | ICD-10-CM | POA: Diagnosis not present

## 2023-09-26 DIAGNOSIS — I10 Essential (primary) hypertension: Secondary | ICD-10-CM

## 2023-09-26 DIAGNOSIS — E785 Hyperlipidemia, unspecified: Secondary | ICD-10-CM | POA: Diagnosis not present

## 2023-09-26 NOTE — Assessment & Plan Note (Signed)
Coronary artery calcification with a CAC score of 170 in 2021, placing him in the 71st percentile. No chest discomfort suggestive of angina. -Continue Aspirin 81mg  daily and Repatha 140mg  every two weeks.

## 2023-09-26 NOTE — Patient Instructions (Signed)
Medication Instructions:  Your physician recommends that you continue on your current medications as directed. Please refer to the Current Medication list given to you today.  *If you need a refill on your cardiac medications before your next appointment, please call your pharmacy*   Lab Work: None ordered  If you have labs (blood work) drawn today and your tests are completely normal, you will receive your results only by: MyChart Message (if you have MyChart) OR A paper copy in the mail If you have any lab test that is abnormal or we need to change your treatment, we will call you to review the results.   Testing/Procedures: None ordered   Follow-Up: At [facility], you and your health needs are our priority.  As part of our continuing mission to provide you with exceptional heart care, we have created designated Provider Care Teams.  These Care Teams include your primary Cardiologist (physician) and Advanced Practice Providers (APPs -  Physician Assistants and Nurse Practitioners) who all work together to provide you with the care you need, when you need it.  We recommend signing up for the patient portal called "MyChart".  Sign up information is provided on this After Visit Summary.  MyChart is used to connect with patients for Virtual Visits (Telemedicine).  Patients are able to view lab/test results, encounter notes, upcoming appointments, etc.  Non-urgent messages can be sent to your provider as well.   To learn more about what you can do with MyChart, go to [email or web address].    Your next appointment:   1 year(s)  Provider:   Christell Constant, MD  or Tereso Newcomer, PA-C         Other Instructions

## 2023-09-26 NOTE — Progress Notes (Signed)
Cardiology Office Note:    Date:  09/26/2023  ID:  Jose [name], DOB [birth year], MRN [id number] PCP: Thana Ates, MD  [facility] Providers Cardiologist:  Christell Constant, MD       Patient Profile:      Coronary artery Ca2+  TTE 11/22/13: EF 60-65, no RWMA, Gr 1 DD CAC score 07/24/20: 170 (71st percentile) Aortic atherosclerosis Hypertension  Hyperlipidemia  Statin intol  Fatty liver Hx of CVA, TIA   OSA  S/p parathyroidectomy, partial thyroidectomy 2016         History of Present Illness:  Discussed the use of AI scribe software for clinical note transcription with the patient, who gave verbal consent to proceed.  Jose [name] is a 67 y.o. male who returns for surgical clearance. He needs inguinal hernia repair (bilat) with Dr. Corliss Skains under gen anesthesia. ASA needs to be held. He was last seen by Dr. Eldridge Dace in 04/2022. CCTA was recommended at that time to clear him for shoulder surgery b/c of chest pain. He could not afford this and the CT was never done.  He is here alone. He has not had chest pain, pressure, tightness, shortness of breath, or syncope. He reports being able to breathe comfortably when lying flat and denies any leg swelling. He had a shoulder surgery in April, which went well. The patient's hyperlipidemia is managed by his primary care physician with Repatha 140mg  every two weeks due to statin intolerance. He had recent labs and was told all was good. The patient has a family history of heart disease. His father had a stent placed. The patient denies any history of smoking.    Review of Systems  Cardiovascular:  Negative for claudication.  Gastrointestinal:  Negative for hematochezia and melena.  Genitourinary:  Negative for hematuria.  See HPI     Studies Reviewed:   EKG Interpretation Date/Time:  Tuesday September 26 2023 15:37:22 EDT Ventricular Rate:  74 PR Interval:  148 QRS Duration:  80 QT Interval:  370 QTC Calculation: 410 R  Axis:   33  Text Interpretation: Normal sinus rhythm Low voltage QRS Cannot rule out Anterior infarct , age undetermined No significant change since last tracing Confirmed by Tereso Newcomer [phone]) on 09/26/2023 4:27:54 PM    Results   LABS  Potassium: 4.6 (07/11/2023) Creatinine: 1.69 (07/11/2023) ALT: 124 (07/11/2023) LDL: 56 (11/03/2022)      Risk Assessment/Calculations:             Physical Exam:   VS:  BP 103/72   Pulse 74   Ht 5\' 4"  (1.626 m)   Wt 189 lb 14.4 oz (86.1 kg)   SpO2 98%   BMI 32.60 kg/m    Wt Readings from Last 3 Encounters:  09/26/23 189 lb 14.4 oz (86.1 kg)  04/05/22 189 lb (85.7 kg)  08/03/16 179 lb (81.2 kg)    Constitutional:      Appearance: Healthy appearance. Not in distress.  Neck:     Vascular: No carotid bruit. JVD normal.  Pulmonary:     Breath sounds: Normal breath sounds. No wheezing. No rales.  Cardiovascular:     Normal rate. Regular rhythm.     Murmurs: There is no murmur.  Edema:    Peripheral edema absent.  Abdominal:     Palpations: Abdomen is soft.        Assessment and Plan:   Assessment & Plan Preoperative cardiovascular examination Mr. Daun perioperative risk of a major cardiac  event is 0.9% according to the Revised Cardiac Risk Index (RCRI).  Therefore, he is at low risk for perioperative complications.   His functional capacity is good at 4.31 METs according to the Duke Activity Status Index (DASI). Recommendations: According to ACC/AHA guidelines, no further cardiovascular testing needed.  The patient may proceed to surgery at acceptable risk.   Antiplatelet and/or Anticoagulation Recommendations: Aspirin can be held for 7 days prior to his surgery.  Please resume Aspirin post operatively when it is felt to be safe from a bleeding standpoint.  Coronary artery calcification seen on CT scan Coronary artery calcification with a CAC score of 170 in 2021, placing him in the 71st percentile. No chest discomfort  suggestive of angina. -Continue Aspirin 81mg  daily and Repatha 140mg  every two weeks. Hyperlipidemia LDL goal <70 Managed by primary care. Statin intolerant. -He remains on Repatha 140mg  every two weeks. Essential hypertension Blood pressure controlled. -Continue Amlodipine/Valsartan 5/160mg  daily and Hydrochlorothiazide 25mg  daily.         Dispo:  Return in about 1 year (around 09/25/2024) for Routine Follow Up, w/ Dr. Izora Ribas, or Tereso Newcomer, PA-C.  Signed, Tereso Newcomer, PA-C

## 2023-09-28 NOTE — Telephone Encounter (Signed)
Notes faxed to surgeon. Tereso Newcomer, PA-C    09/28/2023 10:12 AM

## 2023-10-24 NOTE — Patient Instructions (Signed)
SURGICAL WAITING ROOM VISITATION  Patients having surgery or a procedure may have no more than 2 support people in the waiting area - these visitors may rotate.    Children under the age of 57 must have an adult with them who is not the patient.  Due to an increase in RSV and influenza rates and associated hospitalizations, children ages 28 and under may not visit patients in [facility] hospitals.  If the patient needs to stay at the hospital during part of their recovery, the visitor guidelines for inpatient rooms apply. Pre-op nurse will coordinate an appropriate time for 1 support person to accompany patient in pre-op.  This support person may not rotate.    Please refer to the [facility] website for the visitor guidelines for Inpatients (after your surgery is over and you are in a regular room).    Your procedure is scheduled on: 11/08/23   Report to [facility] Main Entrance    Report to admitting at 8:15 AM   Call this number if you have problems the morning of surgery [phone]   Do not eat food :After Midnight.   After Midnight you may have the following liquids until 7:30 AM DAY OF SURGERY  Water Non-Citrus Juices (without pulp, NO RED-Apple, White grape, White cranberry) Black Coffee (NO MILK/CREAM OR CREAMERS, sugar ok)  Clear Tea (NO MILK/CREAM OR CREAMERS, sugar ok) regular and decaf                             Plain Jell-O (NO RED)                                           Fruit ices (not with fruit pulp, NO RED)                                     Popsicles (NO RED)                                                               Sports drinks like Gatorade (NO RED)    The day of surgery:  Drink ONE (1) Pre-Surgery G2 at 7:30 AM the morning of surgery. Drink in one sitting. Do not sip.  This drink was given to you during your hospital  pre-op appointment visit. Nothing else to drink after completing the  Pre-Surgery G2.          If you have questions,  please contact your surgeon's office.   FOLLOW BOWEL PREP AND ANY ADDITIONAL PRE OP INSTRUCTIONS YOU RECEIVED FROM YOUR SURGEON'S OFFICE!!!     Oral Hygiene is also important to reduce your risk of infection.                                    Remember - BRUSH YOUR TEETH THE MORNING OF SURGERY WITH YOUR REGULAR TOOTHPASTE  DENTURES WILL BE REMOVED PRIOR TO SURGERY PLEASE DO NOT APPLY "Poly grip" OR ADHESIVES!!!  Stop all vitamins and herbal supplements 7 days before surgery.   Take these medicines the morning of surgery with A SIP OF WATER: Pantoprazole   How to Manage Your Diabetes Before and After Surgery  Why is it important to control my blood sugar before and after surgery? Improving blood sugar levels before and after surgery helps healing and can limit problems. A way of improving blood sugar control is eating a healthy diet by:  Eating less sugar and carbohydrates  Increasing activity/exercise  Talking with your doctor about reaching your blood sugar goals High blood sugars (greater than 180 mg/dL) can raise your risk of infections and slow your recovery, so you will need to focus on controlling your diabetes during the weeks before surgery. Make sure that the doctor who takes care of your diabetes knows about your planned surgery including the date and location.  How do I manage my blood sugar before surgery? Check your blood sugar at least 4 times a day, starting 2 days before surgery, to make sure that the level is not too high or low. Check your blood sugar the morning of your surgery when you wake up and every 2 hours until you get to the Short Stay unit. If your blood sugar is less than 70 mg/dL, you will need to treat for low blood sugar: Do not take insulin. Treat a low blood sugar (less than 70 mg/dL) with  cup of clear juice (cranberry or apple), 4 glucose tablets, OR glucose gel. Recheck blood sugar in 15 minutes after treatment (to make sure it is greater than  70 mg/dL). If your blood sugar is not greater than 70 mg/dL on recheck, call [phone] for further instructions. Report your blood sugar to the short stay nurse when you get to Short Stay.  If you are admitted to the hospital after surgery: Your blood sugar will be checked by the staff and you will probably be given insulin after surgery (instead of oral diabetes medicines) to make sure you have good blood sugar levels. The goal for blood sugar control after surgery is 80-180 mg/dL.  Reviewed and Endorsed by [facility] Patient Education Committee, August 2015                              You may not have any metal on your body including jewelry, and body piercing             Do not wear lotions, powders, cologne, or deodorant              Men may shave face and neck.   Do not bring valuables to the hospital. [facility] IS NOT             RESPONSIBLE   FOR VALUABLES.   Contacts, glasses, dentures or bridgework may not be worn into surgery.  DO NOT BRING YOUR HOME MEDICATIONS TO THE HOSPITAL. PHARMACY WILL DISPENSE MEDICATIONS LISTED ON YOUR MEDICATION LIST TO YOU DURING YOUR ADMISSION IN THE HOSPITAL!    Patients discharged on the day of surgery will not be allowed to drive home.  Someone NEEDS to stay with you for the first 24 hours after anesthesia.   Special Instructions: Bring a copy of your healthcare power of attorney and living will documents the day of surgery if you haven't scanned them before.              Please read over the  following fact sheets you were given: IF YOU HAVE QUESTIONS ABOUT YOUR PRE-OP INSTRUCTIONS PLEASE CALL [phone]   If you received a COVID test during your pre-op visit  it is requested that you wear a mask when out in public, stay away from anyone that may not be feeling well and notify your surgeon if you develop symptoms. If you test positive for Covid or have been in contact with anyone that has tested positive in the last 10 days  please notify you surgeon.    [facility] - Preparing for Surgery Before surgery, you can play an important role.  Because skin is not sterile, your skin needs to be as free of germs as possible.  You can reduce the number of germs on your skin by washing with CHG (chlorahexidine gluconate) soap before surgery.  CHG is an antiseptic cleaner which kills germs and bonds with the skin to continue killing germs even after washing. Please DO NOT use if you have an allergy to CHG or antibacterial soaps.  If your skin becomes reddened/irritated stop using the CHG and inform your nurse when you arrive at Short Stay. Do not shave (including legs and underarms) for at least 48 hours prior to the first CHG shower.  You may shave your face/neck.  Please follow these instructions carefully:  1.  Shower with CHG Soap the night before surgery and the  morning of surgery.  2.  If you choose to wash your hair, wash your hair first as usual with your normal  shampoo.  3.  After you shampoo, rinse your hair and body thoroughly to remove the shampoo.                             4.  Use CHG as you would any other liquid soap.  You can apply chg directly to the skin and wash.  Gently with a scrungie or clean washcloth.  5.  Apply the CHG Soap to your body ONLY FROM THE NECK DOWN.   Do   not use on face/ open                           Wound or open sores. Avoid contact with eyes, ears mouth and   genitals (private parts).                       Wash face,  Genitals (private parts) with your normal soap.             6.  Wash thoroughly, paying special attention to the area where your    surgery  will be performed.  7.  Thoroughly rinse your body with warm water from the neck down.  8.  DO NOT shower/wash with your normal soap after using and rinsing off the CHG Soap.                9.  Pat yourself dry with a clean towel.            10.  Wear clean pajamas.            11.  Place clean sheets on your bed the night of your  first shower and do not  sleep with pets. Day of Surgery : Do not apply any lotions/deodorants the morning of surgery.  Please wear clean clothes to the hospital/surgery center.  FAILURE TO  FOLLOW THESE INSTRUCTIONS MAY RESULT IN THE CANCELLATION OF YOUR SURGERY  PATIENT SIGNATURE_________________________________  NURSE SIGNATURE__________________________________  ________________________________________________________________________

## 2023-10-24 NOTE — Progress Notes (Signed)
COVID Vaccine Completed: yes  Date of COVID positive in last 75 days:no  PCP - Sneha Raju,MD Cardiologist - Riley Lam, MD  Cardiac clearance by Tereso Newcomer, PA 09/26/23 in Epic  Chest x-ray - n/a EKG - 09/26/23 Epic Stress Test - yes 20 years ago per pt ECHO - 11/22/13 Epic Cardiac Cath - n/a Pacemaker/ICD device last checked: n/a Spinal Cord Stimulator: n/a  Bowel Prep - no  Sleep Study - yes, lost weight CPAP - no  Fasting Blood Sugar - no meds or checks at home Checks Blood Sugar  Last dose of GLP1 agonist-  N/A GLP1 instructions:  Hold 7 days before surgery    Last dose of SGLT-2 inhibitors-  N/A SGLT-2 instructions:  Hold 3 days before surgery    Blood Thinner Instructions:  Time Aspirin Instructions: ASA 81, hold 7 days Last Dose:  Activity level: Can go up a flight of stairs and perform activities of daily living without stopping and without symptoms of chest pain or shortness of breath.  Anesthesia review: DM2, palpitations, coronary artery calcification, HTN, OSA, TIA  Patient denies shortness of breath, fever, cough and chest pain at PAT appointment  Patient verbalized understanding of instructions that were given to them at the PAT appointment. Patient was also instructed that they will need to review over the PAT instructions again at home before surgery.

## 2023-10-25 ENCOUNTER — Encounter (HOSPITAL_COMMUNITY): Payer: Self-pay

## 2023-10-25 ENCOUNTER — Encounter (HOSPITAL_COMMUNITY)
Admission: RE | Admit: 2023-10-25 | Discharge: 2023-10-25 | Disposition: A | Discharge status: Home / Self Care | Payer: Medicare PPO | Source: Ambulatory Visit | Attending: Surgery | Admitting: Surgery

## 2023-10-25 ENCOUNTER — Other Ambulatory Visit: Payer: Self-pay

## 2023-10-25 VITALS — BP 141/97 | HR 83 | Temp 97.9°F | Resp 16 | Ht 64.0 in | Wt 189.0 lb

## 2023-10-25 DIAGNOSIS — K4020 Bilateral inguinal hernia, without obstruction or gangrene, not specified as recurrent: Secondary | ICD-10-CM | POA: Diagnosis not present

## 2023-10-25 DIAGNOSIS — I10 Essential (primary) hypertension: Secondary | ICD-10-CM | POA: Insufficient documentation

## 2023-10-25 DIAGNOSIS — Z8673 Personal history of transient ischemic attack (TIA), and cerebral infarction without residual deficits: Secondary | ICD-10-CM | POA: Insufficient documentation

## 2023-10-25 DIAGNOSIS — E119 Type 2 diabetes mellitus without complications: Secondary | ICD-10-CM | POA: Diagnosis not present

## 2023-10-25 DIAGNOSIS — Z01812 Encounter for preprocedural laboratory examination: Secondary | ICD-10-CM | POA: Insufficient documentation

## 2023-10-25 DIAGNOSIS — G4730 Sleep apnea, unspecified: Secondary | ICD-10-CM | POA: Diagnosis not present

## 2023-10-25 LAB — CBC
HCT: 46.9 % (ref 39.0–52.0)
Hemoglobin: 15.6 g/dL (ref 13.0–17.0)
MCH: 30.6 pg (ref 26.0–34.0)
MCHC: 33.3 g/dL (ref 30.0–36.0)
MCV: 92 fL (ref 80.0–100.0)
Platelets: 161 10*3/uL (ref 150–400)
RBC: 5.1 MIL/uL (ref 4.22–5.81)
RDW: 13.2 % (ref 11.5–15.5)
WBC: 7.4 10*3/uL (ref 4.0–10.5)
nRBC: 0 % (ref 0.0–0.2)

## 2023-10-25 LAB — BASIC METABOLIC PANEL
Anion gap: 7 (ref 5–15)
BUN: 19 mg/dL (ref 8–23)
CO2: 25 mmol/L (ref 22–32)
Calcium: 9.5 mg/dL (ref 8.9–10.3)
Chloride: 102 mmol/L (ref 98–111)
Creatinine, Ser: 1.11 mg/dL (ref 0.61–1.24)
GFR, Estimated: >60 mL/min (ref >60)
Glucose, Bld: 193 mg/dL — ABNORMAL HIGH (ref 70–99)
Potassium: 4.6 mmol/L (ref 3.5–5.1)
Sodium: 134 mmol/L — ABNORMAL LOW (ref 135–145)

## 2023-10-25 LAB — GLUCOSE, CAPILLARY: Glucose-Capillary: 187 mg/dL — ABNORMAL HIGH (ref 70–99)

## 2023-10-25 LAB — HEMOGLOBIN A1C
Hgb A1c MFr Bld: 6.9 % — ABNORMAL HIGH (ref 4.8–5.6)
Mean Plasma Glucose: 151.33 mg/dL

## 2023-10-26 NOTE — Progress Notes (Signed)
Anesthesia Chart Review   Case: [id number] Date/Time: 11/08/23 1015   Procedure: OPEN BILATERAL INGUINAL HERNIA REPAIRS WITH MESH (Bilateral) - LMA & TAP BLOCK   Anesthesia type: General   Pre-op diagnosis: bilateral inguinal hernias   Location[facility] ROOM 01 / [facility]   Surgeons: Manus Rudd, MD       DISCUSSION:67 y.o. never smoker with h/o PONV, HTN, Stroke, sleep apnea, DM II, bilateral inguinal hernias scheduled for above procedure 11/08/2023 with Dr. Manus Rudd.   Pt last seen by cardiology 09/26/23 for preoperative evaluation.  Per OV note, "Mr. Jasinski's perioperative risk of a major cardiac event is 0.9% according to the Revised Cardiac Risk Index (RCRI).  Therefore, he is at low risk for perioperative complications.   His functional capacity is good at 4.31 METs according to the Duke Activity Status Index (DASI). Recommendations: According to ACC/AHA guidelines, no further cardiovascular testing needed.  The patient may proceed to surgery at acceptable risk.   Antiplatelet and/or Anticoagulation Recommendations: Aspirin can be held for 7 days prior to his surgery.  Please resume Aspirin post operatively when it is felt to be safe from a bleeding standpoint."  VS: BP (!) 141/97   Pulse 83   Temp 36.6 C (Oral)   Resp 16   Ht 5\' 4"  (1.626 m)   Wt 85.7 kg   SpO2 100%   BMI 32.44 kg/m   PROVIDERS: Thana Ates, MD is PCP   Cardiologist - Riley Lam, MD  LABS: Labs reviewed: Acceptable for surgery. (all labs ordered are listed, but only abnormal results are displayed)  Labs Reviewed  HEMOGLOBIN A1C - Abnormal; Notable for the following components:      Result Value   Hgb A1c MFr Bld 6.9 (*)    All other components within normal limits  BASIC METABOLIC PANEL - Abnormal; Notable for the following components:   Sodium 134 (*)    Glucose, Bld 193 (*)    All other components within normal limits  GLUCOSE, CAPILLARY - Abnormal; Notable for the following  components:   Glucose-Capillary 187 (*)    All other components within normal limits  CBC     IMAGES:   EKG:   CV:  Past Medical History:  Diagnosis Date   Abnormal ECG 11/04/2013   Q waves noted in 3, aVF, poor R wave progression   Acne rosacea    Anginal pain (HCC)    relating to side pain   Arthritis    BMI 30.0-30.9,adult    Chronic left mastoiditis    Fluid behid left mastoid   Complication of anesthesia    R sided chest pain as complication from a nerve block from shoulder surgery in 2012, had to go to ED. CXR showed R hemidiaphragm elevation and atelectasis.    Coronary artery calcification seen on CT scan 09/26/2023   TTE 11/22/13: EF 60-65, no RWMA, Gr 1 DD CAC score 07/24/20: 170 (71st percentile)    Diverticulitis    Diverticulosis    Dizziness 11/04/2013   ENT eval reassuring.    DJD (degenerative joint disease)    DM (diabetes mellitus) (HCC)    Elevated coronary artery calcium score    Elevated LFTs    chronic   Fatty liver    GERD (gastroesophageal reflux disease)    hx none now since lost wt   History of kidney stones    Hypercholesterolemia    Hypertension    IBS (irritable bowel syndrome)    Lumbar  facet arthropathy    Obesity    Old cerebrovascular accident (CVA) without late effect    Palpitations 11/04/2013   PONV (postoperative nausea and vomiting)    Restless legs    Sleep apnea    lost a lot of weight does not wear CPAP anymore   Stroke (HCC)    tia on mri   TIA (transient ischemic attack)    Ulnar neuropathy     Past Surgical History:  Procedure Laterality Date   COLONOSCOPY     ESOPHAGOGASTRODUODENOSCOPY     EUS N/A 01/11/2013   Procedure: ESOPHAGEAL ENDOSCOPIC ULTRASOUND (EUS) RADIAL;  Surgeon: Willis Modena, MD;  Location: [facility];  Service: Endoscopy;  Laterality: N/A;  radial scope  will bring h&p   EYE SURGERY     retinal repair possibly L eye   kid stones     cysto   LITHOTRIPSY     unable to complete this  procedure   MULTIPLE TOOTH EXTRACTIONS     PARATHYROIDECTOMY Right 08/26/2015   RADIOLOGY WITH ANESTHESIA N/A 08/04/2016   Procedure: MRI OF THE BRAIN WITH AND WITHOUT;  Surgeon: Medication Radiologist, MD;  Location: [facility];  Service: Radiology;  Laterality: N/A;   SHOULDER ARTHROSCOPY  10/18/2011   Procedure: ARTHROSCOPY SHOULDER;  Surgeon: Velna Ochs, MD;  Location: [facility];  Service: Orthopedics;  Laterality: Right;  right shoulder arthroscopy , acromioplasty, partial DCR and rotator cuff repair   THYROIDECTOMY Right 08/26/2015   Procedure: RIGHT PARATHYROID REMOVAL;  Surgeon: Newman Pies, MD;  Location: [facility] OR;  Service: ENT;  Laterality: Right;    MEDICATIONS:  amLODipine-valsartan (EXFORGE) 5-320 MG tablet   aspirin EC 81 MG tablet   Cholecalciferol (VITAMIN D-3) 1000 UNITS CAPS   fluticasone (CUTIVATE) 0.05 % cream   hydrochlorothiazide (MICROZIDE) 12.5 MG capsule   ibuprofen (ADVIL,MOTRIN) 200 MG tablet   pantoprazole (PROTONIX) 40 MG tablet   REPATHA SURECLICK 140 MG/ML SOAJ   No current facility-administered medications for this encounter.     Jodell Cipro Ward, PA-C [facility] Pre-Surgical Testing [phone]

## 2023-11-08 ENCOUNTER — Encounter (HOSPITAL_COMMUNITY): Payer: Self-pay | Admitting: Surgery

## 2023-11-08 ENCOUNTER — Other Ambulatory Visit: Payer: Self-pay

## 2023-11-08 ENCOUNTER — Ambulatory Visit (HOSPITAL_COMMUNITY): Payer: Medicare PPO | Admitting: Physician Assistant

## 2023-11-08 ENCOUNTER — Ambulatory Visit (HOSPITAL_COMMUNITY): Payer: Medicare PPO | Admitting: Anesthesiology

## 2023-11-08 ENCOUNTER — Ambulatory Visit (HOSPITAL_COMMUNITY)
Admission: RE | Admit: 2023-11-08 | Discharge: 2023-11-08 | Disposition: A | Discharge status: Home / Self Care | Payer: Medicare PPO | Source: Ambulatory Visit | Attending: Surgery | Admitting: Surgery

## 2023-11-08 ENCOUNTER — Encounter (HOSPITAL_COMMUNITY)
Admission: RE | Disposition: A | Discharge status: Home / Self Care | Payer: Self-pay | Source: Ambulatory Visit | Attending: Surgery

## 2023-11-08 DIAGNOSIS — K4020 Bilateral inguinal hernia, without obstruction or gangrene, not specified as recurrent: Secondary | ICD-10-CM | POA: Diagnosis not present

## 2023-11-08 DIAGNOSIS — K5730 Diverticulosis of large intestine without perforation or abscess without bleeding: Secondary | ICD-10-CM | POA: Diagnosis not present

## 2023-11-08 DIAGNOSIS — Z7982 Long term (current) use of aspirin: Secondary | ICD-10-CM | POA: Diagnosis not present

## 2023-11-08 DIAGNOSIS — I700 Atherosclerosis of aorta: Secondary | ICD-10-CM | POA: Diagnosis not present

## 2023-11-08 DIAGNOSIS — E119 Type 2 diabetes mellitus without complications: Secondary | ICD-10-CM | POA: Insufficient documentation

## 2023-11-08 DIAGNOSIS — I10 Essential (primary) hypertension: Secondary | ICD-10-CM | POA: Diagnosis not present

## 2023-11-08 DIAGNOSIS — Z79899 Other long term (current) drug therapy: Secondary | ICD-10-CM | POA: Diagnosis not present

## 2023-11-08 DIAGNOSIS — Z6832 Body mass index (BMI) 32.0-32.9, adult: Secondary | ICD-10-CM | POA: Insufficient documentation

## 2023-11-08 DIAGNOSIS — I2510 Atherosclerotic heart disease of native coronary artery without angina pectoris: Secondary | ICD-10-CM | POA: Diagnosis not present

## 2023-11-08 DIAGNOSIS — K4090 Unilateral inguinal hernia, without obstruction or gangrene, not specified as recurrent: Secondary | ICD-10-CM | POA: Diagnosis not present

## 2023-11-08 DIAGNOSIS — G8918 Other acute postprocedural pain: Secondary | ICD-10-CM | POA: Diagnosis not present

## 2023-11-08 DIAGNOSIS — N400 Enlarged prostate without lower urinary tract symptoms: Secondary | ICD-10-CM | POA: Diagnosis not present

## 2023-11-08 DIAGNOSIS — E669 Obesity, unspecified: Secondary | ICD-10-CM | POA: Diagnosis not present

## 2023-11-08 DIAGNOSIS — K219 Gastro-esophageal reflux disease without esophagitis: Secondary | ICD-10-CM | POA: Diagnosis not present

## 2023-11-08 DIAGNOSIS — R109 Unspecified abdominal pain: Secondary | ICD-10-CM | POA: Diagnosis not present

## 2023-11-08 HISTORY — PX: INGUINAL HERNIA REPAIR: SHX194

## 2023-11-08 LAB — GLUCOSE, CAPILLARY: Glucose-Capillary: 130 mg/dL — ABNORMAL HIGH (ref 70–99)

## 2023-11-08 SURGERY — REPAIR, HERNIA, INGUINAL, BILATERAL, ADULT
Anesthesia: General | Laterality: Bilateral

## 2023-11-08 MED ORDER — EPHEDRINE SULFATE (PRESSORS) 50 MG/ML IJ SOLN
INTRAMUSCULAR | Status: DC | PRN
  Administered 2023-11-08: 10 mg via INTRAVENOUS

## 2023-11-08 MED ORDER — OXYCODONE HCL 5 MG PO TABS
ORAL_TABLET | ORAL | Status: AC
  Administered 2023-11-08: 5 mg via ORAL
  Filled 2023-11-08: qty 1

## 2023-11-08 MED ORDER — PROPOFOL 10 MG/ML IV BOLUS
INTRAVENOUS | Status: DC | PRN
  Administered 2023-11-08: 200 mg via INTRAVENOUS

## 2023-11-08 MED ORDER — KETOROLAC TROMETHAMINE 30 MG/ML IJ SOLN
INTRAMUSCULAR | Status: DC | PRN
  Administered 2023-11-08: 30 mg via INTRAVENOUS

## 2023-11-08 MED ORDER — BUPIVACAINE HCL (PF) 0.25 % IJ SOLN
INTRAMUSCULAR | Status: DC | PRN
  Administered 2023-11-08 (×2): 30 mL

## 2023-11-08 MED ORDER — 0.9 % SODIUM CHLORIDE (POUR BTL) OPTIME
TOPICAL | Status: DC | PRN
  Administered 2023-11-08: 1000 mL

## 2023-11-08 MED ORDER — MIDAZOLAM HCL 2 MG/2ML IJ SOLN
INTRAMUSCULAR | Status: AC
  Filled 2023-11-08: qty 2

## 2023-11-08 MED ORDER — KETOROLAC TROMETHAMINE 30 MG/ML IJ SOLN
INTRAMUSCULAR | Status: AC
  Filled 2023-11-08: qty 1

## 2023-11-08 MED ORDER — LACTATED RINGERS IV SOLN: INTRAVENOUS | Status: DC

## 2023-11-08 MED ORDER — CHLORHEXIDINE GLUCONATE 0.12 % MT SOLN
15.0000 mL | Freq: Once | OROMUCOSAL | Status: AC
  Administered 2023-11-08: 15 mL via OROMUCOSAL

## 2023-11-08 MED ORDER — DEXAMETHASONE SODIUM PHOSPHATE 10 MG/ML IJ SOLN
INTRAMUSCULAR | Status: AC
  Filled 2023-11-08: qty 1

## 2023-11-08 MED ORDER — CHLORHEXIDINE GLUCONATE CLOTH 2 % EX PADS: 6.0000 | MEDICATED_PAD | Freq: Once | CUTANEOUS | Status: DC

## 2023-11-08 MED ORDER — MIDAZOLAM HCL 2 MG/2ML IJ SOLN
1.0000 mg | INTRAMUSCULAR | Status: DC
  Administered 2023-11-08: 2 mg via INTRAVENOUS

## 2023-11-08 MED ORDER — BUPIVACAINE-EPINEPHRINE 0.25% -1:200000 IJ SOLN
INTRAMUSCULAR | Status: DC | PRN
  Administered 2023-11-08: 20 mL

## 2023-11-08 MED ORDER — FENTANYL CITRATE (PF) 100 MCG/2ML IJ SOLN
INTRAMUSCULAR | Status: DC | PRN
  Administered 2023-11-08: 25 ug via INTRAVENOUS
  Administered 2023-11-08: 50 ug via INTRAVENOUS
  Administered 2023-11-08: 25 ug via INTRAVENOUS

## 2023-11-08 MED ORDER — PROPOFOL 10 MG/ML IV BOLUS
INTRAVENOUS | Status: AC
  Filled 2023-11-08: qty 20

## 2023-11-08 MED ORDER — PHENYLEPHRINE 80 MCG/ML (10ML) SYRINGE FOR IV PUSH (FOR BLOOD PRESSURE SUPPORT)
PREFILLED_SYRINGE | INTRAVENOUS | Status: AC
Start: 2023-11-08 — End: ?
  Filled 2023-11-08: qty 10

## 2023-11-08 MED ORDER — BUPIVACAINE-EPINEPHRINE 0.25% -1:200000 IJ SOLN
INTRAMUSCULAR | Status: AC
  Filled 2023-11-08: qty 1

## 2023-11-08 MED ORDER — OXYCODONE HCL 5 MG PO TABS: 5.0000 mg | ORAL_TABLET | Freq: Four times a day (QID) | ORAL | 0 refills | Status: AC | PRN

## 2023-11-08 MED ORDER — EPHEDRINE 5 MG/ML INJ
INTRAVENOUS | Status: AC
  Filled 2023-11-08: qty 5

## 2023-11-08 MED ORDER — ACETAMINOPHEN 500 MG PO TABS
ORAL_TABLET | ORAL | Status: AC
  Filled 2023-11-08: qty 2

## 2023-11-08 MED ORDER — DEXAMETHASONE SODIUM PHOSPHATE 10 MG/ML IJ SOLN
INTRAMUSCULAR | Status: DC | PRN
  Administered 2023-11-08: 8 mg via INTRAVENOUS

## 2023-11-08 MED ORDER — ONDANSETRON HCL 4 MG/2ML IJ SOLN: 4.0000 mg | Freq: Once | INTRAMUSCULAR | Status: AC | PRN

## 2023-11-08 MED ORDER — CEFAZOLIN SODIUM-DEXTROSE 2-4 GM/100ML-% IV SOLN
2.0000 g | INTRAVENOUS | Status: AC
  Administered 2023-11-08: 2 g via INTRAVENOUS

## 2023-11-08 MED ORDER — LIDOCAINE HCL (PF) 2 % IJ SOLN
INTRAMUSCULAR | Status: AC
  Filled 2023-11-08: qty 5

## 2023-11-08 MED ORDER — HYDROMORPHONE HCL 1 MG/ML IJ SOLN: 0.2500 mg | INTRAMUSCULAR | Status: DC | PRN

## 2023-11-08 MED ORDER — ONDANSETRON HCL 4 MG/2ML IJ SOLN
INTRAMUSCULAR | Status: AC
  Filled 2023-11-08: qty 2

## 2023-11-08 MED ORDER — ONDANSETRON HCL 4 MG/2ML IJ SOLN
INTRAMUSCULAR | Status: DC | PRN
  Administered 2023-11-08: 4 mg via INTRAVENOUS

## 2023-11-08 MED ORDER — ORAL CARE MOUTH RINSE: 15.0000 mL | Freq: Once | OROMUCOSAL | Status: AC

## 2023-11-08 MED ORDER — KETOROLAC TROMETHAMINE 30 MG/ML IJ SOLN: 30.0000 mg | Freq: Once | INTRAMUSCULAR | Status: DC | PRN

## 2023-11-08 MED ORDER — OXYCODONE HCL 5 MG PO TABS: 5.0000 mg | ORAL_TABLET | Freq: Once | ORAL | Status: AC | PRN

## 2023-11-08 MED ORDER — ACETAMINOPHEN 500 MG PO TABS
1000.0000 mg | ORAL_TABLET | ORAL | Status: AC
  Administered 2023-11-08: 1000 mg via ORAL

## 2023-11-08 MED ORDER — FENTANYL CITRATE (PF) 100 MCG/2ML IJ SOLN
INTRAMUSCULAR | Status: AC
  Filled 2023-11-08: qty 2

## 2023-11-08 MED ORDER — LACTATED RINGERS IV SOLN: INTRAVENOUS | Status: DC | PRN

## 2023-11-08 MED ORDER — CEFAZOLIN SODIUM-DEXTROSE 2-4 GM/100ML-% IV SOLN
INTRAVENOUS | Status: AC
  Filled 2023-11-08: qty 100

## 2023-11-08 MED ORDER — OXYCODONE HCL 5 MG/5ML PO SOLN: 5.0000 mg | Freq: Once | ORAL | Status: AC | PRN

## 2023-11-08 MED ORDER — LIDOCAINE HCL (CARDIAC) PF 100 MG/5ML IV SOSY
PREFILLED_SYRINGE | INTRAVENOUS | Status: DC | PRN
  Administered 2023-11-08: 100 mg via INTRAVENOUS

## 2023-11-08 MED ORDER — ONDANSETRON HCL 4 MG/2ML IJ SOLN
INTRAMUSCULAR | Status: AC
  Administered 2023-11-08: 4 mg via INTRAVENOUS
  Filled 2023-11-08: qty 2

## 2023-11-08 MED ORDER — ROCURONIUM BROMIDE 10 MG/ML (PF) SYRINGE
PREFILLED_SYRINGE | INTRAVENOUS | Status: AC
  Filled 2023-11-08: qty 10

## 2023-11-08 MED ORDER — PHENYLEPHRINE HCL (PRESSORS) 10 MG/ML IV SOLN
INTRAVENOUS | Status: DC | PRN
  Administered 2023-11-08: 80 ug via INTRAVENOUS

## 2023-11-08 MED ORDER — MIDAZOLAM HCL 2 MG/2ML IJ SOLN
INTRAMUSCULAR | Status: AC
Start: 2023-11-08 — End: ?
  Filled 2023-11-08: qty 2

## 2023-11-08 MED ORDER — FENTANYL CITRATE PF 50 MCG/ML IJ SOSY
PREFILLED_SYRINGE | INTRAMUSCULAR | Status: AC
  Filled 2023-11-08: qty 1

## 2023-11-08 MED ORDER — FENTANYL CITRATE PF 50 MCG/ML IJ SOSY
50.0000 ug | PREFILLED_SYRINGE | Freq: Once | INTRAMUSCULAR | Status: AC
  Administered 2023-11-08: 50 ug via INTRAVENOUS

## 2023-11-08 SURGICAL SUPPLY — 41 items
BAG COUNTER SPONGE SURGICOUNT (BAG) IMPLANT
BENZOIN TINCTURE PRP APPL 2/3 (GAUZE/BANDAGES/DRESSINGS) IMPLANT
BLADE SURG 15 STRL LF DISP TIS (BLADE) ×2 IMPLANT
CHLORAPREP W/TINT 26 (MISCELLANEOUS) ×2 IMPLANT
CLSR STERI-STRIP ANTIMIC 1/2X4 (GAUZE/BANDAGES/DRESSINGS) IMPLANT
COVER SURGICAL LIGHT HANDLE (MISCELLANEOUS) ×2 IMPLANT
DISSECTOR ROUND CHERRY 3/8 STR (MISCELLANEOUS) IMPLANT
DRAIN PENROSE 0.5X18 (DRAIN) IMPLANT
DRAPE LAPAROTOMY TRNSV 102X78 (DRAPES) ×2 IMPLANT
DRAPE UTILITY XL STRL (DRAPES) ×2 IMPLANT
DRSG TEGADERM 4X4.75 (GAUZE/BANDAGES/DRESSINGS) IMPLANT
ELECT PENCIL ROCKER SW 15FT (MISCELLANEOUS) ×2 IMPLANT
ELECT REM PT RETURN 15FT ADLT (MISCELLANEOUS) ×2 IMPLANT
GAUZE 4X4 16PLY ~~LOC~~+RFID DBL (SPONGE) ×2 IMPLANT
GAUZE SPONGE 4X4 12PLY STRL (GAUZE/BANDAGES/DRESSINGS) ×2 IMPLANT
GLOVE BIO SURGEON STRL SZ7 (GLOVE) ×2 IMPLANT
GLOVE BIOGEL PI IND STRL 7.5 (GLOVE) ×2 IMPLANT
GOWN STRL REUS W/ TWL LRG LVL3 (GOWN DISPOSABLE) ×2 IMPLANT
KIT BASIN OR (CUSTOM PROCEDURE TRAY) ×2 IMPLANT
KIT TURNOVER KIT A (KITS) IMPLANT
MARKER SKIN DUAL TIP RULER LAB (MISCELLANEOUS) ×2 IMPLANT
MESH PARIETEX PROGRIP LEFT (Mesh General) IMPLANT
MESH PARIETEX PROGRIP RIGHT (Mesh General) IMPLANT
NDL HYPO 25X1 1.5 SAFETY (NEEDLE) ×2 IMPLANT
NEEDLE HYPO 25X1 1.5 SAFETY (NEEDLE) ×1
PACK BASIC VI WITH GOWN DISP (CUSTOM PROCEDURE TRAY) ×2 IMPLANT
SPIKE FLUID TRANSFER (MISCELLANEOUS) ×2 IMPLANT
STRIP CLOSURE SKIN 1/2X4 (GAUZE/BANDAGES/DRESSINGS) ×2 IMPLANT
SUT MNCRL AB 4-0 PS2 18 (SUTURE) ×2 IMPLANT
SUT PROLENE 2 0 SH DA (SUTURE) IMPLANT
SUT SILK 3-0 18XBRD TIE 12 (SUTURE) IMPLANT
SUT VIC AB 2-0 CT2 27 (SUTURE) IMPLANT
SUT VIC AB 2-0 SH 27X BRD (SUTURE) ×2 IMPLANT
SUT VIC AB 3-0 SH 27X BRD (SUTURE) ×2 IMPLANT
SUT VICRYL 0 27 CT2 27 ABS (SUTURE) IMPLANT
SUT VICRYL 0 UR6 27IN ABS (SUTURE) IMPLANT
SYR 20ML LL LF (SYRINGE) ×2 IMPLANT
SYR BULB IRRIG 60ML STRL (SYRINGE) ×2 IMPLANT
TOWEL OR 17X26 10 PK STRL BLUE (TOWEL DISPOSABLE) ×2 IMPLANT
TOWEL OR NON WOVEN STRL DISP B (DISPOSABLE) ×2 IMPLANT
YANKAUER SUCT BULB TIP 10FT TU (MISCELLANEOUS) ×2 IMPLANT

## 2023-11-08 NOTE — Anesthesia Procedure Notes (Signed)
Procedure Name: LMA Insertion Date/Time: 11/08/2023 10:13 AM  Performed by: Jamelle Rushing, CRNAPre-anesthesia Checklist: Patient identified, Emergency Drugs available, Suction available, Patient being monitored and Timeout performed Patient Re-evaluated:Patient Re-evaluated prior to induction Oxygen Delivery Method: Circle system utilized Preoxygenation: Pre-oxygenation with 100% oxygen Induction Type: IV induction Ventilation: Mask ventilation without difficulty LMA: LMA inserted LMA Size: 4.0 Number of attempts: 2 Placement Confirmation: positive ETCO2 and breath sounds checked- equal and bilateral Dental Injury: Teeth and Oropharynx as per pre-operative assessment

## 2023-11-08 NOTE — Anesthesia Procedure Notes (Signed)
Anesthesia Procedure Image    

## 2023-11-08 NOTE — Op Note (Signed)
Open Bilateral inguinal hernia repair with mesh Procedure Note  Indications: This is a 67 year old male who presents with a 15-year history of intermittent discomfort in his left groin. Recently, this has become more noticeable. Patient recently had a issue with a right kidney stone. He had a CT scan performed without contrast. This noted the presence of bilateral fat-containing inguinal hernias. He is referred to [place] to discuss surgical repair. The patient recently underwent upper endoscopy by Dr. Dulce Sellar for epigastric symptoms. He is negative for gallstones.   Pre-operative Diagnosis: bilateral reducible inguinal hernia Post-operative Diagnosis: same  Surgeon: Wynona Luna   Assistants: none  Anesthesia: General LMA anesthesia and TAP blocks  ASA Class: 2  Procedure Details  The patient was seen again in the Holding Room. The risks, benefits, complications, treatment options, and expected outcomes were discussed with the patient. The possibilities of reaction to medication, pulmonary aspiration, perforation of viscus, bleeding, recurrent infection, the need for additional procedures, and development of a complication requiring transfusion or further operation were discussed with the patient and/or family. The likelihood of success in repairing the hernia and returning the patient to their previous functional status is good.  There was concurrence with the proposed plan, and informed consent was obtained. The site of surgery was properly noted/marked. The patient was taken to the Operating Room, identified as Jose [name], and the procedure verified as bilateral inguinal hernia repair. A Time Out was held and the above information confirmed.  The patient was placed in the supine position and underwent induction of anesthesia. The lower abdomen and groins were prepped with Chloraprep and draped in the standard fashion.     We began on the patient's left side.  0.25% Marcaine with epinephrine  was used to anesthetize the skin over the mid-portion of the inguinal canal. An oblique incision was made. Dissection was carried down through the subcutaneous tissue with cautery to the external oblique fascia.  We opened the external oblique fascia along the direction of its fibers to the external ring.  The spermatic cord was circumferentially dissected bluntly and retracted with a Penrose drain.  The ilioinguinal nerve was identified and preserved.  The floor of the inguinal canal was inspected and was intact.  We skeletonized the spermatic cord and reduced a large fat-containing indirect hernia sac.  We used a left Progrip mesh which was inserted and deployed across the floor of the inguinal canal. The mesh was tucked underneath the external oblique fascia laterally.  The flap of the mesh was closed around the spermatic cord to recreate the internal inguinal ring.  The mesh was secured to the pubic tubercle with 0 Vicryl.  Additional stay sutures were placed in the shelving edge inferiorly and also to close the mesh flap.  The external oblique fascia was reapproximated with 2-0 Vicryl.  3-0 Vicryl was used to close the subcutaneous tissues and 4-0 Monocryl was used to close the skin in subcuticular fashion.  We then turned our attention to the right side. 0.25% Marcaine with epinephrine was used to anesthetize the skin over the mid-portion of the inguinal canal. An oblique incision was made. Dissection was carried down through the subcutaneous tissue with cautery to the external oblique fascia.  We opened the external oblique fascia along the direction of its fibers to the external ring.  The spermatic cord was circumferentially dissected bluntly and retracted with a Penrose drain.  The ilioinguinal nerve was identified and preserved.  The floor of the inguinal canal  was inspected and was intact.  We skeletonized the spermatic cord and reduced a large fat-containing indirect hernia sac.  We used a right  Progrip mesh which was inserted and deployed across the floor of the inguinal canal. The mesh was tucked underneath the external oblique fascia laterally.  The flap of the mesh was closed around the spermatic cord to recreate the internal inguinal ring.  The mesh was secured to the pubic tubercle with 0 Vicryl.  Additional stay sutures were placed in the shelving edge inferiorly and also to close the mesh flap. The external oblique fascia was reapproximated with 2-0 Vicryl.  3-0 Vicryl was used to close the subcutaneous tissues and 4-0 Monocryl was used to close the skin in subcuticular fashion.  Benzoin and steri-strips were used to seal the incisions.  Clean dressings were applied.  The patient was then extubated and brought to the recovery room in stable condition.  All sponge, instrument, and needle counts were correct prior to closure and at the conclusion of the case.   Estimated Blood Loss: Minimal                 Complications: None; patient tolerated the procedure well.         Disposition: PACU - hemodynamically stable.         Condition: stable  Wilmon Arms. Corliss Skains, MD, [facility]  General Surgery   11/08/2023 11:55 AM

## 2023-11-08 NOTE — Progress Notes (Signed)
1030 Spoke with Dr. Okey Dupre will not do the diabetic periop protocol. CBC 130 a 0909.

## 2023-11-08 NOTE — Anesthesia Preprocedure Evaluation (Signed)
Anesthesia Evaluation  Patient identified by MRN, date of birth, ID band Patient awake    Reviewed: Allergy & Precautions, H&P , NPO status , Patient's Chart, lab work & pertinent test results  Airway Mallampati: II  TM Distance: >3 FB Neck ROM: Full    Dental no notable dental hx.    Pulmonary neg pulmonary ROS   Pulmonary exam normal breath sounds clear to auscultation       Cardiovascular hypertension, + CAD  Normal cardiovascular exam Rhythm:Regular Rate:Normal     Neuro/Psych TIACVA  negative psych ROS   GI/Hepatic Neg liver ROS,GERD  ,,  Endo/Other  diabetes    Renal/GU negative Renal ROS  negative genitourinary   Musculoskeletal negative musculoskeletal ROS (+)    Abdominal   Peds negative pediatric ROS (+)  Hematology negative hematology ROS (+)   Anesthesia Other Findings   Reproductive/Obstetrics negative OB ROS                             Anesthesia Physical Anesthesia Plan  ASA: 3  Anesthesia Plan: General   Post-op Pain Management: Regional block*   Induction: Intravenous  PONV Risk Score and Plan: 2 and Ondansetron, Dexamethasone and Treatment may vary due to age or medical condition  Airway Management Planned: LMA  Additional Equipment:   Intra-op Plan:   Post-operative Plan: Extubation in OR  Informed Consent: I have reviewed the patients History and Physical, chart, labs and discussed the procedure including the risks, benefits and alternatives for the proposed anesthesia with the patient or authorized representative who has indicated his/her understanding and acceptance.     Dental advisory given  Plan Discussed with: CRNA and Surgeon  Anesthesia Plan Comments:        Anesthesia Quick Evaluation

## 2023-11-08 NOTE — Transfer of Care (Signed)
Immediate Anesthesia Transfer of Care Note  Patient: Jose [name]  Procedure(s) Performed: OPEN BILATERAL INGUINAL HERNIA REPAIRS WITH MESH (Bilateral)  Patient Location: PACU  Anesthesia Type:General  Level of Consciousness: drowsy  Airway & Oxygen Therapy: Patient Spontanous Breathing and Patient connected to face mask oxygen  Post-op Assessment: Report given to RN and Post -op Vital signs reviewed and stable  Post vital signs: Reviewed and stable  Last Vitals:  Vitals Value Taken Time  BP 122/70 11/08/23 1158  Temp    Pulse 66 11/08/23 1159  Resp 15 11/08/23 1159  SpO2 100 % 11/08/23 1159  Vitals shown include unfiled device data.  Last Pain:  Vitals:   11/08/23 1000  TempSrc:   PainSc: 0-No pain      Patients Stated Pain Goal: 0 (11/08/23 0916)  Complications: No notable events documented.

## 2023-11-08 NOTE — Anesthesia Postprocedure Evaluation (Signed)
Anesthesia Post Note  Patient: Jose [name]  Procedure(s) Performed: OPEN BILATERAL INGUINAL HERNIA REPAIRS WITH MESH (Bilateral)     Patient location during evaluation: PACU Anesthesia Type: General Level of consciousness: awake and alert Pain management: pain level controlled Vital Signs Assessment: post-procedure vital signs reviewed and stable Respiratory status: spontaneous breathing, nonlabored ventilation, respiratory function stable and patient connected to nasal cannula oxygen Cardiovascular status: blood pressure returned to baseline and stable Postop Assessment: no apparent nausea or vomiting Anesthetic complications: no  No notable events documented.  Last Vitals:  Vitals:   11/08/23 1245 11/08/23 1300  BP: 119/71 117/63  Pulse: 67 62  Resp: 17 18  Temp:    SpO2: 95% 97%    Last Pain:  Vitals:   11/08/23 1200  TempSrc:   PainSc: Asleep                 Mekel Haverstock S

## 2023-11-08 NOTE — Discharge Instructions (Signed)
[facility]   INGUINAL HERNIA REPAIR: POST OP INSTRUCTIONS  Always review your discharge instruction sheet given to you by the facility where your surgery was performed. IF YOU HAVE DISABILITY OR FAMILY LEAVE FORMS, YOU MUST BRING THEM TO THE OFFICE FOR PROCESSING.   DO NOT GIVE THEM TO YOUR DOCTOR.  1. A  prescription for pain medication may be given to you upon discharge.  Take your pain medication as prescribed, if needed.  If narcotic pain medicine is not needed, then you may take acetaminophen (Tylenol) or ibuprofen (Advil) as needed. 2. Take your usually prescribed medications unless otherwise directed. If you need a refill on your pain medication, please contact your pharmacy.  They will contact our office to request authorization. Prescriptions will not be filled after 5 pm or on week-ends. 3. You should follow a light diet the first 24 hours after arrival home, such as soup and crackers, etc.  Be sure to include lots of fluids daily.  Resume your normal diet the day after surgery. 4.Most patients will experience some swelling and bruising around the umbilicus or in the groin and scrotum.  Ice packs and reclining will help.  Swelling and bruising can take several days to resolve.  6. It is common to experience some constipation if taking pain medication after surgery.  Increasing fluid intake and taking a stool softener (such as Colace) will usually help or prevent this problem from occurring.  A mild laxative (Milk of Magnesia or Miralax) should be taken according to package directions if there are no bowel movements after 48 hours. 7. Unless discharge instructions indicate otherwise, you may remove your bandages 48 hours after surgery, and you may shower at that time.  You may have steri-strips (small skin tapes) in place directly over the incision.  These strips should be left on the skin for 7-10 days.   8. ACTIVITIES:  You may resume regular (light) daily  activities beginning the next day--such as daily self-care, walking, climbing stairs--gradually increasing activities as tolerated.  You may have sexual intercourse when it is comfortable.  Refrain from any heavy lifting or straining until approved by your doctor.  a.You may drive when you are no longer taking prescription pain medication, you can comfortably wear a seatbelt, and you can safely maneuver your car and apply brakes. b.RETURN TO WORK:   _____________________________________________  9.You should see your doctor in the office for a follow-up appointment approximately 2-3 weeks after your surgery.  Make sure that you call for this appointment within a day or two after you arrive home to insure a convenient appointment time. 10.OTHER INSTRUCTIONS: _________________________    _____________________________________  WHEN TO CALL YOUR DOCTOR: Fever over 101.0 Inability to urinate Nausea and/or vomiting Extreme swelling or bruising Continued bleeding from incision. Increased pain, redness, or drainage from the incision  The clinic staff is available to answer your questions during regular business hours.  Please don't hesitate to call and ask to speak to one of the nurses for clinical concerns.  If you have a medical emergency, go to the nearest emergency room or call 911.  A surgeon from [facility] is always on call at the hospital   [address], [place], [place]  [address] ?  [address] [address], [place], [place]   [address] [phone] ? [phone] ? FAX [phone] Web site: [email or web address]

## 2023-11-08 NOTE — H&P (Signed)
Subjective    Chief Complaint: New Consultation       History of Present Illness: Jose [name] is a 67 y.o. male who is seen today as an office consultation at the request of Dr. Dulce Sellar for evaluation of New Consultation .   This is a 67 year old male who presents with a 15-year history of intermittent discomfort in his left groin.  Recently, this has become more noticeable.  Patient recently had a issue with a right kidney stone.  He had a CT scan performed without contrast.  This noted the presence of bilateral fat-containing inguinal hernias.  He is referred to [place] to discuss surgical repair.  The patient recently underwent upper endoscopy by Dr. Dulce Sellar for epigastric symptoms.  He is negative for gallstones.     Review of Systems: A complete review of systems was obtained from the patient.  I have reviewed this information and discussed as appropriate with the patient.  See HPI as well for other ROS.   Review of Systems  Constitutional: Negative.   HENT: Negative.    Eyes: Negative.   Respiratory: Negative.    Cardiovascular: Negative.   Gastrointestinal:  Positive for abdominal pain.  Genitourinary: Negative.   Musculoskeletal: Negative.   Skin: Negative.   Neurological: Negative.   Endo/Heme/Allergies: Negative.   Psychiatric/Behavioral: Negative.          Medical History: Past Medical History         Past Medical History:  Diagnosis Date   Abnormal ECG     Arthritis     CVA (cerebral vascular accident) (CMS/HHS-HCC)     Diverticulitis     DM (diabetes mellitus) (CMS/HHS-HCC)     Elevated LFTs     Fatty liver     GERD (gastroesophageal reflux disease)     Hypercholesteremia     Hypertension     IBS (irritable bowel syndrome)     Obesity     Restless leg     Sleep apnea      Does not wear CPAP   TIA (transient ischemic attack)          Problem List       Patient Active Problem List  Diagnosis   Abnormal ECG   Diabetes mellitus (CMS/HHS-HCC)    Obesity, unspecified   Palpitations   S/P parathyroidectomy        Past Surgical History           Past Surgical History:  Procedure Laterality Date   ARTHROSCOPIC ROTATOR CUFF REPAIR Bilateral 2012    2024   PARATHYROIDECTOMY   08/26/2015   THYROIDECTOMY TOTAL   08/26/2015        Allergies           Allergies  Allergen Reactions   Pravastatin Other (See Comments)   Rosuvastatin Other (See Comments)   Lisinopril Other (See Comments)      dehydration   Penicillins Rash   Sulfa (Sulfonamide Antibiotics) Nausea      nausea        Medications Ordered Prior to Encounter             Current Outpatient Medications on File Prior to Visit  Medication Sig Dispense Refill   amLODIPine-valsartan (EXFORGE) 5-320 mg per tablet Take 1 tablet by mouth once daily       aspirin 81 MG EC tablet Take 81 mg by mouth once daily       cholecalciferol (VITAMIN D3) 1000 unit capsule Take 1,000 Units by mouth  once daily       hydroCHLOROthiazide (HYDRODIURIL) 25 MG tablet take 1 tablet by mouth every day in the morning for 30 days       pantoprazole (PROTONIX) 40 MG DR tablet TAKE 1 TABLET BY MOUTH EVERY OTHER DAY FOR 90 DAYS        No current facility-administered medications on file prior to visit.        Family History           Family History  Problem Relation Age of Onset   Brain cancer Mother     Diabetes Father     Myocardial Infarction (Heart attack) Father     Coronary Artery Disease (Blocked arteries around heart) Father     Diabetes Maternal Grandmother     Stroke Maternal Grandmother          Tobacco Use History  Social History         Tobacco Use  Smoking Status Never  Smokeless Tobacco Never        Social History  Social History           Socioeconomic History   Marital status: Married  Tobacco Use   Smoking status: Never   Smokeless tobacco: Never  Substance and Sexual Activity   Alcohol use: Never   Drug use: Never    Social Determinants of  Health      Received from [employer]    Social Network        Objective:           Vitals:    09/15/23 0947  BP: (!) 140/90  Pulse: 83  Temp: 37.1 C (98.8 F)  SpO2: 99%  Weight: 87.5 kg (192 lb 12.8 oz)  Height: 162.6 cm (5\' 4" )  PainSc: 1   PainLoc: Abdomen    Body mass index is 33.09 kg/m.   Physical Exam    Constitutional:  WDWN in NAD, conversant, no obvious deformities; lying in bed comfortably Eyes:  Pupils equal, round; sclera anicteric; moist conjunctiva; no lid lag HENT:  Oral mucosa moist; good dentition  Neck:  No masses palpated, trachea midline; no thyromegaly Lungs:  CTA bilaterally; normal respiratory effort CV:  Regular rate and rhythm; no murmurs; extremities well-perfused with no edema Abd:  +bowel sounds, soft, non-tender, no palpable organomegaly; no palpable hernias GU: Bilateral descended testes, no testicular masses, palpable reducible bilateral inguinal hernias Musc: Normal gait; no apparent clubbing or cyanosis in extremities Lymphatic:  No palpable cervical or axillary lymphadenopathy Skin:  Warm, dry; no sign of jaundice Psychiatric - alert and oriented x 4; calm mood and affect     Labs, Imaging and Diagnostic Testing: CLINICAL DATA:  Right upper quadrant pain.   EXAM: CT ABDOMEN AND PELVIS WITHOUT CONTRAST   TECHNIQUE: Multidetector CT imaging of the abdomen and pelvis was performed following the standard protocol without IV contrast.   RADIATION DOSE REDUCTION: This exam was performed according to the departmental dose-optimization program which includes automated exposure control, adjustment of the mA and/or kV according to patient size and/or use of iterative reconstruction technique.   COMPARISON:  August 16, 2012   FINDINGS: Lower chest: No acute abnormality.   Hepatobiliary: No focal liver abnormality is seen. No gallstones, gallbladder wall thickening, or biliary dilatation.   Pancreas: Unremarkable. No  pancreatic ductal dilatation or surrounding inflammatory changes.   Spleen: Normal in size without focal abnormality.   Adrenals/Urinary Tract: Adrenal glands are unremarkable. Kidneys are normal in size, without focal  lesions. A 1 mm to 2 mm obstructing renal calculus is seen within the distal right ureter (axial CT image 72, CT series 3), with mild right-sided hydronephrosis and hydroureter. Bladder is unremarkable.   Stomach/Bowel: Stomach is within normal limits. Appendix appears normal. No evidence of bowel wall thickening, distention, or inflammatory changes. Noninflamed diverticula are seen throughout the descending and sigmoid colon.   Vascular/Lymphatic: Aortic atherosclerosis. No enlarged abdominal or pelvic lymph nodes.   Reproductive: There is moderate severity prostate gland enlargement.   Other: Stable, bilateral, proximally 3.1 cm diameter fat containing inguinal hernias are seen.   No abdominopelvic ascites.   Musculoskeletal: No acute or significant osseous findings.   IMPRESSION: 1. 1 mm to 2 mm obstructing renal calculus within the distal right ureter. 2. Colonic diverticulosis. 3. Moderate severity prostate gland enlargement. 4. Stable, bilateral, fat containing inguinal hernias. 5. Aortic atherosclerosis.   Aortic Atherosclerosis (ICD10-I70.0).     Electronically Signed   By: Aram Candela M.D.   On: 07/12/2023 03:03   Assessment and Plan:  Diagnoses and all orders for this visit:   Non-recurrent bilateral inguinal hernia without obstruction or gangrene     Recommend open repair of bilateral inguinal hernias with mesh.The surgical procedure has been discussed with the patient.  Potential risks, benefits, alternative treatments, and expected outcomes have been explained.  All of the patient's questions at this time have been answered.  The likelihood of reaching the patient's treatment goal is good.  The patient understands the proposed  surgical procedure and wishes to proceed.   Wilmon Arms. Corliss Skains, MD, [facility]  General Surgery   11/08/2023 8:14 AM

## 2023-11-08 NOTE — Anesthesia Procedure Notes (Signed)
Anesthesia Regional Block: TAP block   Pre-Anesthetic Checklist: , timeout performed,  Correct Patient, Correct Site, Correct Laterality,  Correct Procedure, Correct Position, site marked,  Risks and benefits discussed,  Surgical consent,  Pre-op evaluation,  At surgeon's request and post-op pain management  Laterality: Left and Right  Prep: chloraprep       Needles:  Injection technique: Single-shot  Needle Type: Echogenic Needle     Needle Length: 9cm      Additional Needles:   Procedures:,,,, ultrasound used (permanent image in chart),,    Narrative:  Start time: 11/08/2023 9:42 AM End time: 11/08/2023 9:52 AM Injection made incrementally with aspirations every 5 mL.  Performed by: Personally  Anesthesiologist: Eilene Ghazi, MD  Additional Notes: Patient tolerated the procedure well without complications

## 2023-11-09 ENCOUNTER — Encounter (HOSPITAL_COMMUNITY): Payer: Self-pay | Admitting: Surgery

## 2023-12-18 DIAGNOSIS — E119 Type 2 diabetes mellitus without complications: Secondary | ICD-10-CM | POA: Diagnosis not present

## 2023-12-18 DIAGNOSIS — H2513 Age-related nuclear cataract, bilateral: Secondary | ICD-10-CM | POA: Diagnosis not present

## 2024-01-06 DIAGNOSIS — U071 COVID-19: Secondary | ICD-10-CM | POA: Diagnosis not present

## 2024-01-13 DIAGNOSIS — L570 Actinic keratosis: Secondary | ICD-10-CM | POA: Diagnosis not present

## 2024-01-13 DIAGNOSIS — B372 Candidiasis of skin and nail: Secondary | ICD-10-CM | POA: Diagnosis not present

## 2024-02-01 DIAGNOSIS — Z79899 Other long term (current) drug therapy: Secondary | ICD-10-CM | POA: Diagnosis not present

## 2024-02-01 DIAGNOSIS — M47816 Spondylosis without myelopathy or radiculopathy, lumbar region: Secondary | ICD-10-CM | POA: Diagnosis not present

## 2024-02-01 DIAGNOSIS — Z125 Encounter for screening for malignant neoplasm of prostate: Secondary | ICD-10-CM | POA: Diagnosis not present

## 2024-02-01 DIAGNOSIS — E7800 Pure hypercholesterolemia, unspecified: Secondary | ICD-10-CM | POA: Diagnosis not present

## 2024-02-01 DIAGNOSIS — K760 Fatty (change of) liver, not elsewhere classified: Secondary | ICD-10-CM | POA: Diagnosis not present

## 2024-02-01 DIAGNOSIS — I10 Essential (primary) hypertension: Secondary | ICD-10-CM | POA: Diagnosis not present

## 2024-02-01 DIAGNOSIS — Z0000 Encounter for general adult medical examination without abnormal findings: Secondary | ICD-10-CM | POA: Diagnosis not present

## 2024-02-01 DIAGNOSIS — R931 Abnormal findings on diagnostic imaging of heart and coronary circulation: Secondary | ICD-10-CM | POA: Diagnosis not present

## 2024-02-01 DIAGNOSIS — E1159 Type 2 diabetes mellitus with other circulatory complications: Secondary | ICD-10-CM | POA: Diagnosis not present

## 2024-02-19 DIAGNOSIS — R748 Abnormal levels of other serum enzymes: Secondary | ICD-10-CM | POA: Diagnosis not present

## 2024-02-23 DIAGNOSIS — L400 Psoriasis vulgaris: Secondary | ICD-10-CM | POA: Diagnosis not present

## 2024-02-23 DIAGNOSIS — B372 Candidiasis of skin and nail: Secondary | ICD-10-CM | POA: Diagnosis not present

## 2024-02-27 DIAGNOSIS — R748 Abnormal levels of other serum enzymes: Secondary | ICD-10-CM | POA: Diagnosis not present

## 2024-02-27 DIAGNOSIS — M2550 Pain in unspecified joint: Secondary | ICD-10-CM | POA: Diagnosis not present

## 2024-02-27 DIAGNOSIS — E041 Nontoxic single thyroid nodule: Secondary | ICD-10-CM | POA: Diagnosis not present

## 2024-03-18 DIAGNOSIS — K4020 Bilateral inguinal hernia, without obstruction or gangrene, not specified as recurrent: Secondary | ICD-10-CM | POA: Diagnosis not present

## 2024-03-30 DIAGNOSIS — L400 Psoriasis vulgaris: Secondary | ICD-10-CM | POA: Diagnosis not present

## 2024-04-08 DIAGNOSIS — R748 Abnormal levels of other serum enzymes: Secondary | ICD-10-CM | POA: Diagnosis not present

## 2024-04-08 DIAGNOSIS — R799 Abnormal finding of blood chemistry, unspecified: Secondary | ICD-10-CM | POA: Diagnosis not present

## 2024-04-22 DIAGNOSIS — K4020 Bilateral inguinal hernia, without obstruction or gangrene, not specified as recurrent: Secondary | ICD-10-CM | POA: Diagnosis not present

## 2024-04-22 NOTE — Progress Notes (Signed)
 "  PROVIDER:  DONNICE DEWAYNE LIMA, MD  MRN: [id number] DOB: 1956-11-05 DATE OF ENCOUNTER: 04/22/2024 Interval History:   This is a 68 year old male who presents with a 15-year history of intermittent discomfort in his left groin. Recently, this has become more noticeable. Patient recently had a issue with a right kidney stone. He had a CT scan performed without contrast. This noted the presence of bilateral fat-containing inguinal hernias. He is referred to us  to discuss surgical repair. The patient recently underwent upper endoscopy by Dr. Burnette for epigastric symptoms. He is negative for gallstones.   On 11/08/2023, he underwent bilateral inguinal hernia repairs with mesh.  On the left side, he had a large indirect hernia sac.  He also had a large indirect hernia on his right side.  He came to the office 11/20/23 with concerns about swelling of the penis.  This began about a week ago, which was a week after surgery.  The patient is uncircumcised and the swelling is making it difficult for urination.  He has previously seen Dr. Carolee at [facility] for kidney stones.   At his last visit 12/11/23, the swelling around the penis has resolved.  Appetite and bowel movements are normal.  He still has some sensitivity of the skin around the incisions.  He had some tenderness at the right pubic tubercle.   The tenderness on the right side has resolved.  He is having some symptoms on the left.  When the patient wakes up in the morning there is no pain.  However over the course of the day he develops significant sensitivity at the medial part of his left inguinal incision.  He denies any swelling.  The pain becomes fairly severe and radiates around towards his hip and buttock.  Since the last visit on 03/18/2024, the groin pain has improved.  He is no longer taking the Neurontin.  He does have significant pain in the left lumbar region.  This is off to the side away from his spine.  There is no radiation down the  back of his leg.  There is no radiation around to his groin.  There is some pulling all the way up his back to the base of his neck.  Physical Examination:   Physical Exam   Bilateral inguinal incisions healing well with no sign of infection.  No sign of recurrent hernia.  No significant tenderness.  No testicular swelling. Palpation of the left lumbar region shows some mild soreness over the quadratus lumborum.  No palpable masses in this area.   Assessment and Plan:   Jose [name] is a 68 y.o. male who underwent bilateral inguinal hernia repairs with mesh on 11/08/2023.  Diagnoses and all orders for this visit:  Non-recurrent bilateral inguinal hernia without obstruction or gangrene    The residual nerve impingement from the surgery seems to have resolved.  He has some chronic tightness of the quadratus lumborum and the lumbar region of the back.  This does not appear to be a herniated disc although he should be evaluated by a physical therapist for further evaluation and treatment.  Return if symptoms worsen or fail to improve.   The plan was discussed in detail with the patient today, who expressed understanding.  The patient has my contact information, and understands to call me with any additional questions or concerns in the interval.  I would be happy to see the patient back sooner if the need arises.   MATTHEW KAI TSUEI, MD  "

## 2024-05-01 DIAGNOSIS — M545 Low back pain, unspecified: Secondary | ICD-10-CM | POA: Diagnosis not present

## 2024-05-10 DIAGNOSIS — M545 Low back pain, unspecified: Secondary | ICD-10-CM | POA: Diagnosis not present

## 2024-05-10 DIAGNOSIS — S161XXD Strain of muscle, fascia and tendon at neck level, subsequent encounter: Secondary | ICD-10-CM | POA: Diagnosis not present

## 2024-05-10 DIAGNOSIS — R634 Abnormal weight loss: Secondary | ICD-10-CM | POA: Diagnosis not present

## 2024-05-10 DIAGNOSIS — M51369 Other intervertebral disc degeneration, lumbar region without mention of lumbar back pain or lower extremity pain: Secondary | ICD-10-CM | POA: Diagnosis not present

## 2024-05-13 DIAGNOSIS — M6281 Muscle weakness (generalized): Secondary | ICD-10-CM | POA: Diagnosis not present

## 2024-05-13 DIAGNOSIS — R293 Abnormal posture: Secondary | ICD-10-CM | POA: Diagnosis not present

## 2024-05-13 DIAGNOSIS — R2689 Other abnormalities of gait and mobility: Secondary | ICD-10-CM | POA: Diagnosis not present

## 2024-05-13 DIAGNOSIS — M545 Low back pain, unspecified: Secondary | ICD-10-CM | POA: Diagnosis not present

## 2024-05-17 DIAGNOSIS — R2689 Other abnormalities of gait and mobility: Secondary | ICD-10-CM | POA: Diagnosis not present

## 2024-05-17 DIAGNOSIS — R293 Abnormal posture: Secondary | ICD-10-CM | POA: Diagnosis not present

## 2024-05-17 DIAGNOSIS — M6281 Muscle weakness (generalized): Secondary | ICD-10-CM | POA: Diagnosis not present

## 2024-05-17 DIAGNOSIS — M545 Low back pain, unspecified: Secondary | ICD-10-CM | POA: Diagnosis not present

## 2024-05-21 DIAGNOSIS — R293 Abnormal posture: Secondary | ICD-10-CM | POA: Diagnosis not present

## 2024-05-21 DIAGNOSIS — M6281 Muscle weakness (generalized): Secondary | ICD-10-CM | POA: Diagnosis not present

## 2024-05-21 DIAGNOSIS — M545 Low back pain, unspecified: Secondary | ICD-10-CM | POA: Diagnosis not present

## 2024-05-21 DIAGNOSIS — R2689 Other abnormalities of gait and mobility: Secondary | ICD-10-CM | POA: Diagnosis not present

## 2024-05-22 ENCOUNTER — Other Ambulatory Visit: Payer: Self-pay | Admitting: Internal Medicine

## 2024-05-22 DIAGNOSIS — M51369 Other intervertebral disc degeneration, lumbar region without mention of lumbar back pain or lower extremity pain: Secondary | ICD-10-CM

## 2024-05-23 DIAGNOSIS — R293 Abnormal posture: Secondary | ICD-10-CM | POA: Diagnosis not present

## 2024-05-23 DIAGNOSIS — R2689 Other abnormalities of gait and mobility: Secondary | ICD-10-CM | POA: Diagnosis not present

## 2024-05-23 DIAGNOSIS — M545 Low back pain, unspecified: Secondary | ICD-10-CM | POA: Diagnosis not present

## 2024-05-23 DIAGNOSIS — M6281 Muscle weakness (generalized): Secondary | ICD-10-CM | POA: Diagnosis not present

## 2024-05-27 DIAGNOSIS — M6281 Muscle weakness (generalized): Secondary | ICD-10-CM | POA: Diagnosis not present

## 2024-05-27 DIAGNOSIS — R2689 Other abnormalities of gait and mobility: Secondary | ICD-10-CM | POA: Diagnosis not present

## 2024-05-27 DIAGNOSIS — M545 Low back pain, unspecified: Secondary | ICD-10-CM | POA: Diagnosis not present

## 2024-05-27 DIAGNOSIS — R293 Abnormal posture: Secondary | ICD-10-CM | POA: Diagnosis not present

## 2024-05-29 DIAGNOSIS — M6281 Muscle weakness (generalized): Secondary | ICD-10-CM | POA: Diagnosis not present

## 2024-05-29 DIAGNOSIS — R293 Abnormal posture: Secondary | ICD-10-CM | POA: Diagnosis not present

## 2024-05-29 DIAGNOSIS — M545 Low back pain, unspecified: Secondary | ICD-10-CM | POA: Diagnosis not present

## 2024-05-29 DIAGNOSIS — R2689 Other abnormalities of gait and mobility: Secondary | ICD-10-CM | POA: Diagnosis not present

## 2024-05-30 DIAGNOSIS — I10 Essential (primary) hypertension: Secondary | ICD-10-CM | POA: Diagnosis not present

## 2024-05-30 DIAGNOSIS — E1159 Type 2 diabetes mellitus with other circulatory complications: Secondary | ICD-10-CM | POA: Diagnosis not present

## 2024-05-30 DIAGNOSIS — E7800 Pure hypercholesterolemia, unspecified: Secondary | ICD-10-CM | POA: Diagnosis not present

## 2024-05-30 DIAGNOSIS — K760 Fatty (change of) liver, not elsewhere classified: Secondary | ICD-10-CM | POA: Diagnosis not present

## 2024-06-04 DIAGNOSIS — M545 Low back pain, unspecified: Secondary | ICD-10-CM | POA: Diagnosis not present

## 2024-06-04 DIAGNOSIS — M6281 Muscle weakness (generalized): Secondary | ICD-10-CM | POA: Diagnosis not present

## 2024-06-04 DIAGNOSIS — R293 Abnormal posture: Secondary | ICD-10-CM | POA: Diagnosis not present

## 2024-06-04 DIAGNOSIS — R2689 Other abnormalities of gait and mobility: Secondary | ICD-10-CM | POA: Diagnosis not present

## 2024-06-06 DIAGNOSIS — M6281 Muscle weakness (generalized): Secondary | ICD-10-CM | POA: Diagnosis not present

## 2024-06-06 DIAGNOSIS — M545 Low back pain, unspecified: Secondary | ICD-10-CM | POA: Diagnosis not present

## 2024-06-06 DIAGNOSIS — R2689 Other abnormalities of gait and mobility: Secondary | ICD-10-CM | POA: Diagnosis not present

## 2024-06-06 DIAGNOSIS — R293 Abnormal posture: Secondary | ICD-10-CM | POA: Diagnosis not present

## 2024-06-12 DIAGNOSIS — M6281 Muscle weakness (generalized): Secondary | ICD-10-CM | POA: Diagnosis not present

## 2024-06-12 DIAGNOSIS — R293 Abnormal posture: Secondary | ICD-10-CM | POA: Diagnosis not present

## 2024-06-12 DIAGNOSIS — R2689 Other abnormalities of gait and mobility: Secondary | ICD-10-CM | POA: Diagnosis not present

## 2024-06-12 DIAGNOSIS — M545 Low back pain, unspecified: Secondary | ICD-10-CM | POA: Diagnosis not present

## 2024-06-14 DIAGNOSIS — M545 Low back pain, unspecified: Secondary | ICD-10-CM | POA: Diagnosis not present

## 2024-06-14 DIAGNOSIS — R2689 Other abnormalities of gait and mobility: Secondary | ICD-10-CM | POA: Diagnosis not present

## 2024-06-14 DIAGNOSIS — R293 Abnormal posture: Secondary | ICD-10-CM | POA: Diagnosis not present

## 2024-06-14 DIAGNOSIS — M6281 Muscle weakness (generalized): Secondary | ICD-10-CM | POA: Diagnosis not present

## 2024-06-17 DIAGNOSIS — R2689 Other abnormalities of gait and mobility: Secondary | ICD-10-CM | POA: Diagnosis not present

## 2024-06-17 DIAGNOSIS — M545 Low back pain, unspecified: Secondary | ICD-10-CM | POA: Diagnosis not present

## 2024-06-17 DIAGNOSIS — R293 Abnormal posture: Secondary | ICD-10-CM | POA: Diagnosis not present

## 2024-06-17 DIAGNOSIS — M6281 Muscle weakness (generalized): Secondary | ICD-10-CM | POA: Diagnosis not present

## 2024-06-19 DIAGNOSIS — M6281 Muscle weakness (generalized): Secondary | ICD-10-CM | POA: Diagnosis not present

## 2024-06-19 DIAGNOSIS — R293 Abnormal posture: Secondary | ICD-10-CM | POA: Diagnosis not present

## 2024-06-19 DIAGNOSIS — M47817 Spondylosis without myelopathy or radiculopathy, lumbosacral region: Secondary | ICD-10-CM | POA: Diagnosis not present

## 2024-06-19 DIAGNOSIS — R2689 Other abnormalities of gait and mobility: Secondary | ICD-10-CM | POA: Diagnosis not present

## 2024-06-19 DIAGNOSIS — M48061 Spinal stenosis, lumbar region without neurogenic claudication: Secondary | ICD-10-CM | POA: Diagnosis not present

## 2024-06-19 DIAGNOSIS — M545 Low back pain, unspecified: Secondary | ICD-10-CM | POA: Diagnosis not present

## 2024-06-19 DIAGNOSIS — M47816 Spondylosis without myelopathy or radiculopathy, lumbar region: Secondary | ICD-10-CM | POA: Diagnosis not present

## 2024-06-19 DIAGNOSIS — M4807 Spinal stenosis, lumbosacral region: Secondary | ICD-10-CM | POA: Diagnosis not present

## 2024-06-22 DIAGNOSIS — D2239 Melanocytic nevi of other parts of face: Secondary | ICD-10-CM | POA: Diagnosis not present

## 2024-06-22 DIAGNOSIS — D485 Neoplasm of uncertain behavior of skin: Secondary | ICD-10-CM | POA: Diagnosis not present

## 2024-06-22 DIAGNOSIS — L570 Actinic keratosis: Secondary | ICD-10-CM | POA: Diagnosis not present

## 2024-07-12 DIAGNOSIS — N399 Disorder of urinary system, unspecified: Secondary | ICD-10-CM | POA: Diagnosis not present

## 2024-07-12 DIAGNOSIS — R21 Rash and other nonspecific skin eruption: Secondary | ICD-10-CM | POA: Diagnosis not present

## 2024-07-12 DIAGNOSIS — L304 Erythema intertrigo: Secondary | ICD-10-CM | POA: Diagnosis not present

## 2024-07-12 DIAGNOSIS — R519 Headache, unspecified: Secondary | ICD-10-CM | POA: Diagnosis not present

## 2024-07-12 DIAGNOSIS — L409 Psoriasis, unspecified: Secondary | ICD-10-CM | POA: Diagnosis not present

## 2024-07-12 DIAGNOSIS — R5381 Other malaise: Secondary | ICD-10-CM | POA: Diagnosis not present

## 2024-07-17 DIAGNOSIS — E7800 Pure hypercholesterolemia, unspecified: Secondary | ICD-10-CM | POA: Diagnosis not present

## 2024-07-17 DIAGNOSIS — I2510 Atherosclerotic heart disease of native coronary artery without angina pectoris: Secondary | ICD-10-CM | POA: Diagnosis not present

## 2024-07-22 DIAGNOSIS — D485 Neoplasm of uncertain behavior of skin: Secondary | ICD-10-CM | POA: Diagnosis not present

## 2024-07-22 DIAGNOSIS — L400 Psoriasis vulgaris: Secondary | ICD-10-CM | POA: Diagnosis not present

## 2024-07-22 DIAGNOSIS — L300 Nummular dermatitis: Secondary | ICD-10-CM | POA: Diagnosis not present

## 2024-07-22 DIAGNOSIS — L308 Other specified dermatitis: Secondary | ICD-10-CM | POA: Diagnosis not present

## 2024-08-13 DIAGNOSIS — L400 Psoriasis vulgaris: Secondary | ICD-10-CM | POA: Diagnosis not present

## 2024-08-13 DIAGNOSIS — L300 Nummular dermatitis: Secondary | ICD-10-CM | POA: Diagnosis not present

## 2024-08-29 DIAGNOSIS — E7800 Pure hypercholesterolemia, unspecified: Secondary | ICD-10-CM | POA: Diagnosis not present

## 2024-09-18 DIAGNOSIS — L570 Actinic keratosis: Secondary | ICD-10-CM | POA: Diagnosis not present

## 2024-09-18 DIAGNOSIS — L400 Psoriasis vulgaris: Secondary | ICD-10-CM | POA: Diagnosis not present

## 2024-09-27 DIAGNOSIS — E1159 Type 2 diabetes mellitus with other circulatory complications: Secondary | ICD-10-CM | POA: Diagnosis not present

## 2024-09-27 DIAGNOSIS — K760 Fatty (change of) liver, not elsewhere classified: Secondary | ICD-10-CM | POA: Diagnosis not present

## 2024-09-27 DIAGNOSIS — E7800 Pure hypercholesterolemia, unspecified: Secondary | ICD-10-CM | POA: Diagnosis not present

## 2024-09-27 DIAGNOSIS — I10 Essential (primary) hypertension: Secondary | ICD-10-CM | POA: Diagnosis not present

## 2024-09-27 DIAGNOSIS — I2510 Atherosclerotic heart disease of native coronary artery without angina pectoris: Secondary | ICD-10-CM | POA: Diagnosis not present

## 2024-09-27 DIAGNOSIS — M79672 Pain in left foot: Secondary | ICD-10-CM | POA: Diagnosis not present

## 2024-10-18 DIAGNOSIS — H04122 Dry eye syndrome of left lacrimal gland: Secondary | ICD-10-CM | POA: Diagnosis not present

## 2024-10-18 DIAGNOSIS — Z13228 Encounter for screening for other metabolic disorders: Secondary | ICD-10-CM | POA: Diagnosis not present

## 2024-10-18 DIAGNOSIS — B179 Acute viral hepatitis, unspecified: Secondary | ICD-10-CM | POA: Diagnosis not present

## 2024-10-18 DIAGNOSIS — R6889 Other general symptoms and signs: Secondary | ICD-10-CM | POA: Diagnosis not present

## 2024-10-18 DIAGNOSIS — Z111 Encounter for screening for respiratory tuberculosis: Secondary | ICD-10-CM | POA: Diagnosis not present

## 2024-10-23 DIAGNOSIS — E790 Hyperuricemia without signs of inflammatory arthritis and tophaceous disease: Secondary | ICD-10-CM | POA: Diagnosis not present

## 2024-10-23 DIAGNOSIS — M19041 Primary osteoarthritis, right hand: Secondary | ICD-10-CM | POA: Diagnosis not present

## 2024-10-23 DIAGNOSIS — M79674 Pain in right toe(s): Secondary | ICD-10-CM | POA: Diagnosis not present

## 2025-01-13 ENCOUNTER — Ambulatory Visit: Admitting: Physician Assistant
